# Patient Record
Sex: Female | Born: 1990 | Race: White | Hispanic: No | Marital: Married | Smoking: Former smoker
Health system: Southern US, Community
[De-identification: ages and names within clinical notes are randomized; demographics above are authoritative.]

## PROBLEM LIST (undated history)

## (undated) ENCOUNTER — Inpatient Hospital Stay (HOSPITAL_COMMUNITY): Payer: Self-pay

## (undated) DIAGNOSIS — F329 Major depressive disorder, single episode, unspecified: Secondary | ICD-10-CM

## (undated) DIAGNOSIS — T783XXA Angioneurotic edema, initial encounter: Secondary | ICD-10-CM

## (undated) DIAGNOSIS — J45909 Unspecified asthma, uncomplicated: Secondary | ICD-10-CM

## (undated) DIAGNOSIS — L509 Urticaria, unspecified: Secondary | ICD-10-CM

## (undated) DIAGNOSIS — E669 Obesity, unspecified: Secondary | ICD-10-CM

## (undated) DIAGNOSIS — F32A Depression, unspecified: Secondary | ICD-10-CM

## (undated) HISTORY — PX: DILATION AND CURETTAGE OF UTERUS: SHX78

## (undated) HISTORY — DX: Urticaria, unspecified: L50.9

## (undated) HISTORY — DX: Angioneurotic edema, initial encounter: T78.3XXA

---

## 2016-10-01 ENCOUNTER — Encounter (HOSPITAL_COMMUNITY): Payer: Self-pay | Admitting: *Deleted

## 2016-10-01 ENCOUNTER — Emergency Department (HOSPITAL_COMMUNITY)
Admission: EM | Admit: 2016-10-01 | Discharge: 2016-10-02 | Disposition: A | Discharge status: Home / Self Care | Payer: BLUE CROSS/BLUE SHIELD | Attending: Emergency Medicine | Admitting: Emergency Medicine

## 2016-10-01 DIAGNOSIS — E860 Dehydration: Secondary | ICD-10-CM | POA: Diagnosis not present

## 2016-10-01 DIAGNOSIS — R112 Nausea with vomiting, unspecified: Secondary | ICD-10-CM | POA: Diagnosis present

## 2016-10-01 DIAGNOSIS — R1011 Right upper quadrant pain: Secondary | ICD-10-CM | POA: Diagnosis not present

## 2016-10-01 DIAGNOSIS — R197 Diarrhea, unspecified: Secondary | ICD-10-CM | POA: Diagnosis not present

## 2016-10-01 DIAGNOSIS — F17200 Nicotine dependence, unspecified, uncomplicated: Secondary | ICD-10-CM | POA: Diagnosis not present

## 2016-10-01 HISTORY — DX: Major depressive disorder, single episode, unspecified: F32.9

## 2016-10-01 HISTORY — DX: Obesity, unspecified: E66.9

## 2016-10-01 HISTORY — DX: Depression, unspecified: F32.A

## 2016-10-01 LAB — CBC
HEMATOCRIT: 44.5 % (ref 36.0–46.0)
Hemoglobin: 15.2 g/dL — ABNORMAL HIGH (ref 12.0–15.0)
MCH: 28.8 pg (ref 26.0–34.0)
MCHC: 34.2 g/dL (ref 30.0–36.0)
MCV: 84.4 fL (ref 78.0–100.0)
PLATELETS: 283 10*3/uL (ref 150–400)
RBC: 5.27 MIL/uL — ABNORMAL HIGH (ref 3.87–5.11)
RDW: 12.4 % (ref 11.5–15.5)
WBC: 10.7 10*3/uL — AB (ref 4.0–10.5)

## 2016-10-01 LAB — COMPREHENSIVE METABOLIC PANEL
ALBUMIN: 4.3 g/dL (ref 3.5–5.0)
ALT: 19 U/L (ref 14–54)
AST: 25 U/L (ref 15–41)
Alkaline Phosphatase: 62 U/L (ref 38–126)
Anion gap: 10 (ref 5–15)
BUN: 8 mg/dL (ref 6–20)
CO2: 22 mmol/L (ref 22–32)
CREATININE: 0.87 mg/dL (ref 0.44–1.00)
Calcium: 9 mg/dL (ref 8.9–10.3)
Chloride: 104 mmol/L (ref 101–111)
GFR calc Af Amer: >60 mL/min (ref >60)
GFR calc non Af Amer: >60 mL/min (ref >60)
GLUCOSE: 94 mg/dL (ref 65–99)
Potassium: 3.5 mmol/L (ref 3.5–5.1)
Sodium: 136 mmol/L (ref 135–145)
Total Bilirubin: 0.8 mg/dL (ref 0.3–1.2)
Total Protein: 7 g/dL (ref 6.5–8.1)

## 2016-10-01 LAB — I-STAT BETA HCG BLOOD, ED (MC, WL, AP ONLY): I-stat hCG, quantitative: <5 m[IU]/mL (ref <5)

## 2016-10-01 LAB — URINALYSIS, ROUTINE W REFLEX MICROSCOPIC
BILIRUBIN URINE: NEGATIVE
GLUCOSE, UA: NEGATIVE mg/dL
HGB URINE DIPSTICK: NEGATIVE
Ketones, ur: NEGATIVE mg/dL
Leukocytes, UA: NEGATIVE
Nitrite: NEGATIVE
PH: 5 (ref 5.0–8.0)
PROTEIN: NEGATIVE mg/dL
Specific Gravity, Urine: 1.031 — ABNORMAL HIGH (ref 1.005–1.030)

## 2016-10-01 LAB — LIPASE, BLOOD: LIPASE: 22 U/L (ref 11–51)

## 2016-10-01 MED ORDER — ONDANSETRON 4 MG PO TBDP
ORAL_TABLET | ORAL | Status: AC
  Filled 2016-10-01: qty 1

## 2016-10-01 MED ORDER — ONDANSETRON 4 MG PO TBDP
4.0000 mg | ORAL_TABLET | Freq: Once | ORAL | Status: AC | PRN
  Administered 2016-10-01: 4 mg via ORAL

## 2016-10-01 NOTE — ED Triage Notes (Signed)
Pt reports eating pizza last night. Having n/v/d all night. Has sharp pains to right side that radiates around. Pt is hyperventilating at triage.

## 2016-10-01 NOTE — ED Notes (Signed)
After blood draw at triage, pt began hyperventilating. Pale at triage and reports nausea. Given cool wash cloth, zofran and instructed to deep breath.

## 2016-10-02 MED ORDER — ONDANSETRON HCL 4 MG/2ML IJ SOLN
4.0000 mg | Freq: Once | INTRAMUSCULAR | Status: AC
  Administered 2016-10-02: 4 mg via INTRAVENOUS
  Filled 2016-10-02: qty 2

## 2016-10-02 MED ORDER — ONDANSETRON 8 MG PO TBDP: 8.0000 mg | ORAL_TABLET | Freq: Three times a day (TID) | ORAL | 0 refills | Status: DC | PRN

## 2016-10-02 MED ORDER — SODIUM CHLORIDE 0.9 % IV BOLUS (SEPSIS)
1000.0000 mL | Freq: Once | INTRAVENOUS | Status: AC
  Administered 2016-10-02: 1000 mL via INTRAVENOUS

## 2016-10-02 MED ORDER — FENTANYL CITRATE (PF) 100 MCG/2ML IJ SOLN
25.0000 ug | Freq: Once | INTRAMUSCULAR | Status: AC
  Administered 2016-10-02: 25 ug via INTRAVENOUS
  Filled 2016-10-02: qty 2

## 2016-10-02 NOTE — Discharge Instructions (Signed)
CALL YOUR PRIMARY DOCTOR TODAY FOR FOLLOWUP GALLBLADDER ULTRASOUND  Abdominal (belly) pain can be caused by many things. any cases can be observed and treated at home after initial evaluation in the emergency department. Even though you are being discharged home, abdominal pain can be unpredictable. Therefore, you need a repeated exam if your pain does not resolve, returns, or worsens. Most patients with abdominal pain don't have to be admitted to the hospital or have surgery, but serious problems like appendicitis and gallbladder attacks can start out as nonspecific pain. Many abdominal conditions cannot be diagnosed in one visit, so follow-up evaluations are very important. SEEK IMMEDIATE MEDICAL ATTENTION IF: The pain does not go away or becomes severe, particularly over the next 8-12 hours.  A temperature above 100.84F develops.  Repeated vomiting occurs (multiple episodes).  The pain becomes localized to portions of the abdomen. The right side could possibly be appendicitis. In an adult, the left lower portion of the abdomen could be colitis or diverticulitis.  Blood is being passed in stools or vomit (bright red or black tarry stools).  Return also if you develop chest pain, difficulty breathing, dizziness or fainting, or become confused, poorly responsive, or inconsolable.

## 2016-10-02 NOTE — ED Notes (Signed)
Pt c/o RUQ pain with n/v since yesterday; also R flank pain (burning) intermittently

## 2016-10-02 NOTE — ED Notes (Signed)
Wickline MD at bedside  

## 2016-10-02 NOTE — ED Notes (Signed)
Pt given ginger ale and able to keep it down successfully

## 2016-10-02 NOTE — ED Provider Notes (Signed)
[facility]-EMERGENCY DEPT Provider Note   CSN: [id number] Arrival date & time: 10/01/16  1857   By signing my name below, I, Erica Farrell, attest that this documentation has been prepared under the direction and in the presence of [name], [name], MD. Electronically signed, Erica Farrell, ED Scribe. 10/02/16. 2:27 AM.  History   Chief Complaint Chief Complaint  Patient presents with  . Abdominal Pain  . Emesis  . Diarrhea   The history is provided by the patient and medical records. The history is limited by the condition of the patient. No language interpreter was used.  Abdominal Pain   This is a new problem. The current episode started 2 days ago. The problem occurs daily. The pain is associated with eating and suspicious food intake. The pain is located in the RLQ. The quality of the pain is dull, throbbing and shooting. The pain is at a severity of 5/10. Associated symptoms include diarrhea, nausea, vomiting and dysuria. Pertinent negatives include fever, hematochezia, melena, constipation, hematuria and headaches. The symptoms are relieved by certain positions.  Emesis   This is a new problem. The current episode started 2 days ago. The problem occurs more than 10 times per day. The problem has not changed since onset.The emesis has an appearance of stomach contents. There has been no fever. Associated symptoms include abdominal pain and diarrhea. Pertinent negatives include no chills, no cough, no fever, no headaches, no sweats and no URI. Risk factors include suspect food intake and ill contacts.  Diarrhea   This is a new problem. The current episode started 2 days ago. The problem occurs more than 10 times per day. The problem has not changed since onset.There has been no fever. Associated symptoms include abdominal pain and vomiting. Pertinent negatives include no chills, no sweats, no headaches, no URI and no cough.   Erica GrinderBrandie Farrell is a 26 y.o. female presenting to the Emergency  Department with chief complaint of persistent, episodic severe abdominal pains x 3 days s/p suspicious food intake. Pt states her and her husband at pizza that caused both of them to vomit ~2 days ago, her husband's symptoms have subsided and her symptoms have persisted. Multiple vomiting episodes noted since onset of symptoms with lingering nausea noted currently. Diarrhea, chest pain, SOB, abdominal and flank pains noted. Pt reports decreased solid and liquid intake d/t persistent N/V. She describes gradual onset, intermittent, moderate to severe, R sided abdominal pain radiating to the RLQ and back up to the flank area. No abdominal surgical Hx noted. Dysuria, cough denied.  Past Medical History:  Diagnosis Date  . Depression   . Obesity     There are no active problems to display for this patient.   History reviewed. No pertinent surgical history.  OB History    No data available       Home Medications    Prior to Admission medications   Not on File    Family History History reviewed. No pertinent family history.  Social History Social History  Substance Use Topics  . Smoking status: Current Some Day Smoker  . Smokeless tobacco: Not on file  . Alcohol use No     Allergies   Lexapro [escitalopram oxalate] and Wellbutrin [bupropion]   Review of Systems Review of Systems  Constitutional: Negative for chills and fever.  Respiratory: Positive for shortness of breath. Negative for cough.   Gastrointestinal: Positive for abdominal pain, diarrhea, nausea and vomiting. Negative for constipation, hematochezia and melena.  Genitourinary: Positive  for dysuria and flank pain. Negative for hematuria.  Neurological: Negative for headaches.  All other systems reviewed and are negative.    Physical Exam Updated Vital Signs BP 99/65   Pulse 71   Temp 97.9 F (36.6 C) (Oral)   Resp 18   LMP 09/24/2016   SpO2 96%   Physical Exam CONSTITUTIONAL: Well developed/well  nourished HEAD: Normocephalic/atraumatic EYES: EOMI/PERRL, no icterus ENMT: Mucous membranes dry NECK: supple no meningeal signs SPINE/BACK:entire spine nontender CV: S1/S2 noted, no murmurs/rubs/gallops noted LUNGS: Lungs are clear to auscultation bilaterally, no apparent distress ABDOMEN: soft, mild RUQ tenderness, no rebound or guarding, bowel sounds noted throughout abdomen GU:no cva tenderness NEURO: Pt is awake/alert/appropriate, moves all extremitiesx4.  No facial droop.   EXTREMITIES: pulses normal/equal, full ROM SKIN: warm, color normal PSYCH: no abnormalities of mood noted, alert and oriented to situation   ED Treatments / Results  DIAGNOSTIC STUDIES: Oxygen Saturation is 96% on RA, NL by my interpretation.    COORDINATION OF CARE: 2:10 AM-Discussed next steps with pt. Pt verbalized understanding and is agreeable with the plan. Will order medications and IV fluids.   Labs (all labs ordered are listed, but only abnormal results are displayed) Labs Reviewed  CBC - Abnormal; Notable for the following:       Result Value   WBC 10.7 (*)    RBC 5.27 (*)    Hemoglobin 15.2 (*)    All other components within normal limits  URINALYSIS, ROUTINE W REFLEX MICROSCOPIC - Abnormal; Notable for the following:    Color, Urine AMBER (*)    APPearance HAZY (*)    Specific Gravity, Urine 1.031 (*)    All other components within normal limits  LIPASE, BLOOD  COMPREHENSIVE METABOLIC PANEL  I-STAT BETA HCG BLOOD, ED ([facility], [facility], [facility] ONLY)    EKG  EKG Interpretation None       Radiology No results found.  Procedures Procedures   Medications Ordered in ED Medications  ondansetron (ZOFRAN-ODT) disintegrating tablet 4 mg (4 mg Oral Given 10/01/16 1909)  sodium chloride 0.9 % bolus 1,000 mL (0 mLs Intravenous Stopped 10/02/16 0359)  sodium chloride 0.9 % bolus 1,000 mL (0 mLs Intravenous Stopped 10/02/16 0359)  ondansetron (ZOFRAN) injection 4 mg (4 mg Intravenous Given 10/02/16 0221)    fentaNYL (SUBLIMAZE) injection 25 mcg (25 mcg Intravenous Given 10/02/16 0221)     Initial Impression / Assessment and Plan / ED Course  I have reviewed the triage vital signs and the nursing notes.  Pertinent labs  results that were available during my care of the patient were reviewed by me and considered in my medical decision making (see chart for details).     Pt here in the ED for nausea/vomiting/diarrhea and RUQ Pt is improving, taking PO fluids She did have RUQ tenderness but reports feeling improved I offered US imaging but she declines and will call her PCP later today for followup imaging This seems reasonable Pt otherwise stable for d/c home We discussed strict ER return precautions  Final Clinical Impressions(s) / ED Diagnoses   Final diagnoses:  Nausea vomiting and diarrhea  Dehydration  RUQ abdominal pain    New Prescriptions New Prescriptions   No medications on file  I personally performed the services described in this documentation, which was scribed in my presence. The recorded information has been reviewed and is accurate.        Zadie Rhine, MD 10/02/16 [phone]

## 2017-03-12 ENCOUNTER — Encounter (HOSPITAL_COMMUNITY): Payer: Self-pay | Admitting: Radiology

## 2017-03-12 ENCOUNTER — Emergency Department (HOSPITAL_COMMUNITY): Payer: BLUE CROSS/BLUE SHIELD

## 2017-03-12 ENCOUNTER — Emergency Department (HOSPITAL_COMMUNITY)
Admission: EM | Admit: 2017-03-12 | Discharge: 2017-03-12 | Disposition: A | Discharge status: Home / Self Care | Payer: BLUE CROSS/BLUE SHIELD | Attending: Emergency Medicine | Admitting: Emergency Medicine

## 2017-03-12 DIAGNOSIS — R202 Paresthesia of skin: Secondary | ICD-10-CM | POA: Insufficient documentation

## 2017-03-12 DIAGNOSIS — O99350 Diseases of the nervous system complicating pregnancy, unspecified trimester: Secondary | ICD-10-CM | POA: Diagnosis not present

## 2017-03-12 DIAGNOSIS — G43109 Migraine with aura, not intractable, without status migrainosus: Secondary | ICD-10-CM

## 2017-03-12 DIAGNOSIS — O99330 Smoking (tobacco) complicating pregnancy, unspecified trimester: Secondary | ICD-10-CM | POA: Diagnosis not present

## 2017-03-12 DIAGNOSIS — Z3A00 Weeks of gestation of pregnancy not specified: Secondary | ICD-10-CM | POA: Diagnosis not present

## 2017-03-12 DIAGNOSIS — R51 Headache: Secondary | ICD-10-CM | POA: Insufficient documentation

## 2017-03-12 DIAGNOSIS — F17200 Nicotine dependence, unspecified, uncomplicated: Secondary | ICD-10-CM | POA: Diagnosis not present

## 2017-03-12 DIAGNOSIS — R200 Anesthesia of skin: Secondary | ICD-10-CM

## 2017-03-12 DIAGNOSIS — F329 Major depressive disorder, single episode, unspecified: Secondary | ICD-10-CM | POA: Diagnosis not present

## 2017-03-12 DIAGNOSIS — O99340 Other mental disorders complicating pregnancy, unspecified trimester: Secondary | ICD-10-CM | POA: Diagnosis not present

## 2017-03-12 LAB — DIFFERENTIAL
BASOS PCT: 0 %
Basophils Absolute: 0 10*3/uL (ref 0.0–0.1)
EOS ABS: 0.2 10*3/uL (ref 0.0–0.7)
EOS PCT: 1 %
Lymphocytes Relative: 21 %
Lymphs Abs: 2.9 10*3/uL (ref 0.7–4.0)
MONO ABS: 0.6 10*3/uL (ref 0.1–1.0)
MONOS PCT: 4 %
NEUTROS ABS: 10.4 10*3/uL — AB (ref 1.7–7.7)
Neutrophils Relative %: 74 %

## 2017-03-12 LAB — COMPREHENSIVE METABOLIC PANEL
ALT: 15 U/L (ref 14–54)
ANION GAP: 7 (ref 5–15)
AST: 17 U/L (ref 15–41)
Albumin: 3.9 g/dL (ref 3.5–5.0)
Alkaline Phosphatase: 53 U/L (ref 38–126)
BUN: 9 mg/dL (ref 6–20)
CHLORIDE: 105 mmol/L (ref 101–111)
CO2: 22 mmol/L (ref 22–32)
CREATININE: 0.75 mg/dL (ref 0.44–1.00)
Calcium: 8.9 mg/dL (ref 8.9–10.3)
Glucose, Bld: 100 mg/dL — ABNORMAL HIGH (ref 65–99)
Potassium: 3.5 mmol/L (ref 3.5–5.1)
Sodium: 134 mmol/L — ABNORMAL LOW (ref 135–145)
Total Bilirubin: 0.5 mg/dL (ref 0.3–1.2)
Total Protein: 7 g/dL (ref 6.5–8.1)

## 2017-03-12 LAB — I-STAT TROPONIN, ED: TROPONIN I, POC: 0 ng/mL (ref 0.00–0.08)

## 2017-03-12 LAB — CBC
HEMATOCRIT: 43.2 % (ref 36.0–46.0)
Hemoglobin: 14.8 g/dL (ref 12.0–15.0)
MCH: 29.5 pg (ref 26.0–34.0)
MCHC: 34.3 g/dL (ref 30.0–36.0)
MCV: 86.1 fL (ref 78.0–100.0)
Platelets: 253 10*3/uL (ref 150–400)
RBC: 5.02 MIL/uL (ref 3.87–5.11)
RDW: 12.5 % (ref 11.5–15.5)
WBC: 14.1 10*3/uL — ABNORMAL HIGH (ref 4.0–10.5)

## 2017-03-12 LAB — I-STAT CHEM 8, ED
BUN: 9 mg/dL (ref 6–20)
CREATININE: 0.7 mg/dL (ref 0.44–1.00)
Calcium, Ion: 1.12 mmol/L — ABNORMAL LOW (ref 1.15–1.40)
Chloride: 105 mmol/L (ref 101–111)
GLUCOSE: 102 mg/dL — AB (ref 65–99)
HEMATOCRIT: 44 % (ref 36.0–46.0)
HEMOGLOBIN: 15 g/dL (ref 12.0–15.0)
Potassium: 3.6 mmol/L (ref 3.5–5.1)
Sodium: 138 mmol/L (ref 135–145)
TCO2: 21 mmol/L — AB (ref 22–32)

## 2017-03-12 LAB — APTT: aPTT: 28 seconds (ref 24–36)

## 2017-03-12 LAB — CBG MONITORING, ED: Glucose-Capillary: 92 mg/dL (ref 65–99)

## 2017-03-12 LAB — PROTIME-INR
INR: 1.03
PROTHROMBIN TIME: 13.4 s (ref 11.4–15.2)

## 2017-03-12 MED ORDER — ACETAMINOPHEN 500 MG PO TABS
1000.0000 mg | ORAL_TABLET | Freq: Once | ORAL | Status: AC
  Administered 2017-03-12: 1000 mg via ORAL
  Filled 2017-03-12: qty 2

## 2017-03-12 MED ORDER — SODIUM CHLORIDE 0.9 % IV BOLUS (SEPSIS)
1000.0000 mL | Freq: Once | INTRAVENOUS | Status: AC
  Administered 2017-03-12: 1000 mL via INTRAVENOUS

## 2017-03-12 MED ORDER — METOCLOPRAMIDE HCL 5 MG/ML IJ SOLN
10.0000 mg | Freq: Once | INTRAMUSCULAR | Status: AC
  Administered 2017-03-12: 10 mg via INTRAVENOUS
  Filled 2017-03-12: qty 2

## 2017-03-12 NOTE — Consult Note (Signed)
Neurology Consultation Reason for Consult: Right-sided numbness Referring Physician: Verdie MosherLiu, D  CC: Right-sided numbness  History is obtained from: Patient  HPI: Erica Farrell is a 26 y.o. female who presents with right-sided numbness that started sometime between 1011 and 1130.  She states that she initially noticed tingling in her right hand which then spread up her arm and to include her face.  She also noticed spots in her vision that spread to include most of the right side.  There was associated flashing lights as well as zigzag lines associated with this loss of vision.  The vision change has improved, but she has persistent tingling and numbness of the right side, and she has developed a persistent left-sided headache.   LKW: 11 a.m. tpa given?: no, outside of window, mild symptoms   ROS: A 14 point ROS was performed and is negative except as noted in the HPI.  Past Medical History:  Diagnosis Date  . Depression   . Obesity      No family history on file.   Social History:  reports that she has been smoking.  She does not have any smokeless tobacco history on file. She reports that she does not drink alcohol or use drugs.   Exam: Current vital signs: BP 110/73   Pulse 90   Temp 98.2 F (36.8 C) (Oral)   Resp 17   LMP 09/24/2016   SpO2 98%  Vital signs in last 24 hours: Temp:  [98.2 F (36.8 C)] 98.2 F (36.8 C) (11/13 1529) Pulse Rate:  [82-100] 90 (11/13 1645) Resp:  [9-18] 17 (11/13 1645) BP: (110-136)/(73-102) 110/73 (11/13 1645) SpO2:  [98 %-100 %] 98 % (11/13 1645)   Physical Exam  Constitutional: Appears well-developed and well-nourished.  Psych: Affect appropriate to situation Eyes: No scleral injection HENT: No OP obstrucion Head: Normocephalic.  Cardiovascular: Normal rate and regular rhythm.  Respiratory: Effort normal and breath sounds normal to anterior ascultation GI: Soft.  No distension. There is no tenderness.  Skin: WDI  Neuro: Mental  Status: Patient is awake, alert, oriented to person, place, month, year, and situation. Patient is able to give a clear and coherent history. No signs of aphasia or neglect Cranial Nerves: II: Visual Fields are full. Pupils are equal, round, and reactive to light.   III,IV, VI: EOMI without ptosis or diploplia.  V: Facial sensation is decreased on the right VII: Facial movement is symmetric.  VIII: hearing is intact to voice X: Uvula elevates symmetrically XI: Shoulder shrug is symmetric. XII: tongue is midline without atrophy or fasciculations.  Motor: Tone is normal. Bulk is normal. 5/5 strength was present in all four extremities.  Sensory: Sensation is decreased on the right Cerebellar: FNF intact bilaterally      I have reviewed labs in epic and the results pertinent to this consultation are: CMP-unremarkable CBC-mild leukocytosis  I have reviewed the images obtained: CT head-unremarkable  Impression: 26 year old female with right-sided spreading paresthesias and visual scotoma.  Given that she has never had a complicated migraine prior to this, I think imaging would be prudent but if negative then I think complicated migraine is by far the most likely etiology of the symptoms.  Recommendations: 1) MRI brain, if negative would treat as complicated migraine   Ritta SlotMcNeill Kalum Minner, MD [facility] [phone]  If 7pm- 7am, please page neurology on call as listed in AMION.

## 2017-03-12 NOTE — ED Provider Notes (Signed)
[facility] Provider Note   CSN: [id number] Arrival date & time: 03/12/17  1517   An emergency department physician performed an initial assessment on this suspected stroke patient at 1532.  History   Chief Complaint Chief Complaint  Patient presents with  . Numbness    HPI Erica Farrell is a 26 y.o. female.  The history is provided by the patient.  Neurologic Problem  This is a new problem. The current episode started 3 to 5 hours ago. The problem occurs constantly. The problem has been gradually improving. Associated symptoms include headaches. Pertinent negatives include no chest pain, no abdominal pain and no shortness of breath. Nothing aggravates the symptoms. Nothing relieves the symptoms. She has tried nothing for the symptoms.    26 year old female with history of depression and obesity who presents with right-sided numbness.  She states that she has been in her usual state of health this morning while at the doctor's office for her son's occupational therapy as she had sudden onset of right arm numbness and tingling followed by right tongue and mouth tingling and numbness.  EMS was called to the office, and she states that the blood sugar looked okay and she was not initially taken to the hospital.  They felt it was likely due to anxiety.  She states that her symptoms acutely got worse suddenly involving numbness of the right leg, which prompted her to come to the ED.  States that since she has been in the ED she has now developed a left-sided headache.  States that she has had headaches in the past but this is very atypical for her to have numbness.  Feels nauseated but denies any vomiting.  No vision changes.  Initially felt that she had some speech finding difficulties on symptom onset, but now resolved.  No recent fevers or chills.  Currently she reports that she is pregnant, unknown gestational age or LMP.  Past Medical History:    Diagnosis Date  . Depression   . Obesity     There are no active problems to display for this patient.   No past surgical history on file.  OB History    Gravida Para Term Preterm AB Living   1             SAB TAB Ectopic Multiple Live Births                   Home Medications    Prior to Admission medications   Medication Sig Start Date End Date Taking? Authorizing Provider  acetaminophen (TYLENOL) 325 MG tablet Take 325-650 mg every 6 (six) hours as needed by mouth (for pain or headaches).   Yes [provider]  ondansetron (ZOFRAN ODT) 8 MG disintegrating tablet Take 1 tablet (8 mg total) by mouth every 8 (eight) hours as needed. Patient not taking: Reported on 03/12/2017 10/02/16   [name], Donald, MD    Family History No family history on file.  Social History Social History   Tobacco Use  . Smoking status: Current Some Day Smoker  Substance Use Topics  . Alcohol use: No  . Drug use: No     Allergies   Escitalopram; Lexapro [escitalopram oxalate]; and Wellbutrin [bupropion]   Review of Systems Review of Systems  Constitutional: Negative for fever.  Respiratory: Negative for shortness of breath.   Cardiovascular: Negative for chest pain.  Gastrointestinal: Negative for abdominal pain.  Neurological: Positive for headaches.  All other systems  reviewed and are negative.    Physical Exam Updated Vital Signs BP 117/73   Pulse 89   Temp 98.2 F (36.8 C) (Oral)   Resp (!) 9   LMP 09/24/2016   SpO2 100%   Physical Exam Physical Exam  Nursing note and vitals reviewed. Constitutional: Well developed, well nourished, non-toxic, and in no acute distress Head: Normocephalic and atraumatic.  Mouth/Throat: Oropharynx is clear and moist.  Neck: Normal range of motion. Neck supple.  Cardiovascular: Normal rate and regular rhythm.   Pulmonary/Chest: Effort normal and breath sounds normal.  Abdominal: Soft. There is no tenderness. There is no  rebound and no guarding.  Musculoskeletal: Normal range of motion.  Skin: Skin is warm and dry.  Psychiatric: Cooperative Neurological:  Alert, oriented to person, place, time, and situation. Memory grossly in tact. Fluent speech. No dysarthria or aphasia.  Cranial nerves: VF are full. EOMI without nystagmus. No gaze deviation. Facial muscles symmetric with activation. Sensation to light touch over face in tact bilaterally. Hearing grossly in tact. Palate elevates symmetrically. Head turn and shoulder shrug are intact. Tongue midline.  Reflexes defered.  Muscle bulk and tone normal. No pronator drift. Moves all extremities symmetrically. Sensation to light touch is diminished over the right face, right upper extremity, right lower extremity Coordination reveals no dysmetria with finger to nose. Gait is narrow-based and steady. Non-ataxic.   ED Treatments / Results  Labs (all labs ordered are listed, but only abnormal results are displayed) Labs Reviewed  CBC - Abnormal; Notable for the following components:      Result Value   WBC 14.1 (*)    All other components within normal limits  DIFFERENTIAL - Abnormal; Notable for the following components:   Neutro Abs 10.4 (*)    All other components within normal limits  COMPREHENSIVE METABOLIC PANEL - Abnormal; Notable for the following components:   Sodium 134 (*)    Glucose, Bld 100 (*)    All other components within normal limits  I-STAT CHEM 8, ED - Abnormal; Notable for the following components:   Glucose, Bld 102 (*)    Calcium, Ion 1.12 (*)    TCO2 21 (*)    All other components within normal limits  PROTIME-INR  APTT  I-STAT TROPONIN, ED  CBG MONITORING, ED    EKG  EKG Interpretation  Date/Time:  Tuesday March 12 2017 15:25:25 EST Ventricular Rate:  95 PR Interval:  132 QRS Duration: 90 QT Interval:  350 QTC Calculation: 439 R Axis:   46 Text Interpretation:  Normal sinus rhythm Possible Left atrial enlargement  Borderline ECG No acute changes No old tracing to compare Confirmed by Derwood Kaplananavati, Ankit [phone]([id number]) on 03/12/2017 3:38:48 PM       Radiology Mr Brain Wo Contrast  Result Date: 03/12/2017 CLINICAL DATA:  Right-sided numbness EXAM: MRI HEAD WITHOUT CONTRAST TECHNIQUE: Multiplanar, multiecho pulse sequences of the brain and surrounding structures were obtained without intravenous contrast. COMPARISON:  Head CT 03/12/2017 FINDINGS: Brain: The midline structures are normal. There is no acute infarct or acute hemorrhage. No mass lesion, hydrocephalus, dural abnormality or extra-axial collection. The brain parenchymal signal is normal. No age-advanced or lobar predominant atrophy. No chronic microhemorrhage or superficial siderosis. Vascular: Major intracranial arterial and venous sinus flow voids are preserved. Skull and upper cervical spine: The visualized skull base, calvarium, upper cervical spine and extracranial soft tissues are normal. Sinuses/Orbits: No fluid levels or advanced mucosal thickening. No mastoid or middle ear effusion. Normal orbits. IMPRESSION: Normal  brain MRI. Electronically Signed   By: Deatra Robinson M.D.   On: 03/12/2017 17:42   Ct Head Code Stroke Wo Contrast  Result Date: 03/12/2017 CLINICAL DATA:  Code stroke. 26 year old female with right side weakness and blurred vision. Last seen normal 1100 hours. EXAM: CT HEAD WITHOUT CONTRAST TECHNIQUE: Contiguous axial images were obtained from the base of the skull through the vertex without intravenous contrast. COMPARISON:  None. FINDINGS: Brain: No midline shift, ventriculomegaly, mass effect, evidence of mass lesion, intracranial hemorrhage or evidence of cortically based acute infarction. Gray-white matter differentiation is within normal limits throughout the brain. Vascular: No suspicious intracranial vascular hyperdensity. Skull: Negative.  No acute osseous abnormality identified. Sinuses/Orbits: Well pneumatized. Other: Visualized  orbits and scalp soft tissues are within normal limits. ASPECTS [facility] Stroke Program Early CT Score) - Ganglionic level infarction (caudate, lentiform nuclei, internal capsule, insula, M1-M3 cortex): 7 - Supraganglionic infarction (M4-M6 cortex): 3 Total score (0-10 with 10 being normal): 10 IMPRESSION: 1. Normal noncontrast CT appearance of the brain. 2. ASPECTS is 10. 3. The above was relayed via text pager to Dr. Ritta Slot on 03/12/2017 at 15:52 . Electronically Signed   By: Odessa Fleming M.D.   On: 03/12/2017 15:52    Procedures Procedures (including critical care time)  Medications Ordered in ED Medications  sodium chloride 0.9 % bolus 1,000 mL (1,000 mLs Intravenous New Bag/Given 03/12/17 1858)  acetaminophen (TYLENOL) tablet 1,000 mg (1,000 mg Oral Given 03/12/17 1929)  metoCLOPramide (REGLAN) injection 10 mg (10 mg Intravenous Given 03/12/17 1859)     Initial Impression / Assessment and Plan / ED Course  I have reviewed the triage vital signs and the nursing notes.  Pertinent labs & imaging results that were available during my care of the patient were reviewed by me and considered in my medical decision making (see chart for details).    patient was code stroke activation by Dr. Rhunette Croft in triage.  Patient was also evaluated by Dr. Amada Jupiter from neurology who felt that this is less likely stroke more likely atypical migraine headache.  CT head visualized and shows no acute intracranial processes.  MRI was recommended by neurology and if negative could be discharged home.  In the interim she did receive migraine cocktail with Reglan, Tylenol and IV fluids given that she is pregnant.  She does feel improved in the ED.  MRI visualized and shows no acute intracranial processes.  She will be discharged home with supportive care instructions. Strict return and follow-up instructions reviewed. She expressed understanding of all discharge instructions and felt comfortable with the  plan of care.   Final Clinical Impressions(s) / ED Diagnoses   Final diagnoses:  Numbness    ED Discharge Orders    None       Lavera Guise, MD 03/12/17 2006

## 2017-03-12 NOTE — Discharge Instructions (Signed)
Your MRI is normal. Please follow-up closely with your primary care doctor This is likely atypical migraine per our neurologist. Please continue to take tylenol as needed for headache. Return for worsening symptoms, including confusion, intractable vomiting, escalating pain, or any other symptoms concerning to you.

## 2017-03-12 NOTE — ED Notes (Signed)
Pt verbalizes understanding of d/c instructions. Pt ambulatory at d/c with all belongings.   

## 2017-03-12 NOTE — Code Documentation (Signed)
26[facility] at [id number].  Patient was at daycare with her son at [id number] when she had difficulty filling out paperwork.  She describes right facial numbness that progressed to her right tongue, arm and toes.  She also reports having blurred vision at that time.  Patient has a h/o headaches.  Of note, patient recently found out she is pregnant, however, she has not been to the doctor yet.  Code stroke called in the ED.  Stroke team to the bedside.  NIHSS 1, see documentation for details and code stroke times.  Patient continuing to report right sided decreased sensation on exam.  No acute stroke treatment at this time.  Code stroke canceled.  Bedside handoff with ED RN Lanora ManisElizabeth.

## 2017-03-12 NOTE — ED Notes (Signed)
Patient transported to MRI 

## 2017-03-12 NOTE — ED Triage Notes (Addendum)
Patient LSN 11am pt sts when she was checking in her her child at 11:30 she had acute onset of right eye blurry vision and spots in her vision. Patient states she then had numbness and subjective right sided weakness starting in her hands and then went into her face and tongue. Patient states EMS was called and her BP and CBG were normal and they attributed it to anxiety. Patient was unable to go to hospital then due to taking care of her child. Patietn states symptoms started to ease up but then increased in intensity in the last few hours. Pt ambulatory with steady gait, no facial droop or arm or leg drift noted.

## 2017-03-12 NOTE — ED Notes (Signed)
Code stroke canceled by Dr. Amada JupiterKirkpatrick

## 2017-05-09 ENCOUNTER — Inpatient Hospital Stay (HOSPITAL_COMMUNITY)
Admission: AD | Admit: 2017-05-09 | Discharge: 2017-05-10 | Disposition: A | Discharge status: Home / Self Care | Payer: BLUE CROSS/BLUE SHIELD | Source: Ambulatory Visit | Attending: Obstetrics and Gynecology | Admitting: Obstetrics and Gynecology

## 2017-05-09 ENCOUNTER — Encounter (HOSPITAL_COMMUNITY): Payer: Self-pay | Admitting: *Deleted

## 2017-05-09 ENCOUNTER — Inpatient Hospital Stay (HOSPITAL_COMMUNITY): Payer: BLUE CROSS/BLUE SHIELD

## 2017-05-09 DIAGNOSIS — F17200 Nicotine dependence, unspecified, uncomplicated: Secondary | ICD-10-CM | POA: Insufficient documentation

## 2017-05-09 DIAGNOSIS — Z79899 Other long term (current) drug therapy: Secondary | ICD-10-CM | POA: Diagnosis not present

## 2017-05-09 DIAGNOSIS — Z9889 Other specified postprocedural states: Secondary | ICD-10-CM | POA: Insufficient documentation

## 2017-05-09 DIAGNOSIS — N939 Abnormal uterine and vaginal bleeding, unspecified: Secondary | ICD-10-CM | POA: Diagnosis not present

## 2017-05-09 DIAGNOSIS — O0380 Unspecified complication following complete or unspecified spontaneous abortion: Secondary | ICD-10-CM

## 2017-05-09 DIAGNOSIS — R58 Hemorrhage, not elsewhere classified: Secondary | ICD-10-CM

## 2017-05-09 LAB — CBC WITH DIFFERENTIAL/PLATELET
BASOS PCT: 0 %
Basophils Absolute: 0 10*3/uL (ref 0.0–0.1)
Eosinophils Absolute: 0.3 10*3/uL (ref 0.0–0.7)
Eosinophils Relative: 2 %
HEMATOCRIT: 33.7 % — AB (ref 36.0–46.0)
Hemoglobin: 11.6 g/dL — ABNORMAL LOW (ref 12.0–15.0)
LYMPHS PCT: 25 %
Lymphs Abs: 3.5 10*3/uL (ref 0.7–4.0)
MCH: 29.1 pg (ref 26.0–34.0)
MCHC: 34.4 g/dL (ref 30.0–36.0)
MCV: 84.7 fL (ref 78.0–100.0)
MONOS PCT: 4 %
Monocytes Absolute: 0.5 10*3/uL (ref 0.1–1.0)
NEUTROS ABS: 9.8 10*3/uL — AB (ref 1.7–7.7)
Neutrophils Relative %: 69 %
Platelets: 220 10*3/uL (ref 150–400)
RBC: 3.98 MIL/uL (ref 3.87–5.11)
RDW: 12.4 % (ref 11.5–15.5)
WBC: 14.1 10*3/uL — ABNORMAL HIGH (ref 4.0–10.5)

## 2017-05-09 LAB — SAMPLE TO BLOOD BANK

## 2017-05-09 MED ORDER — KETOROLAC TROMETHAMINE 30 MG/ML IJ SOLN
30.0000 mg | Freq: Once | INTRAMUSCULAR | Status: AC
  Administered 2017-05-09: 30 mg via INTRAVENOUS
  Filled 2017-05-09: qty 1

## 2017-05-09 MED ORDER — SODIUM CHLORIDE 0.9 % IV SOLN
INTRAVENOUS | Status: DC
  Administered 2017-05-09: 22:00:00 via INTRAVENOUS

## 2017-05-09 NOTE — MAU Provider Note (Signed)
Chief Complaint:  Vaginal Bleeding   First Provider Initiated Contact with Patient 05/09/17 2059      HPI: Erica Farrell is a 27 y.o. [id number] who presents to maternity admissions reporting heavy vaginal bleeding after an elective abortion for anencephalic baby on Monday.  Started cramping this evening and then bled heavily with clots. Procedure done at [facility] but patient lives here so EMS brought her here.. She reports vaginal bleeding, but no vaginal itching/burning, urinary symptoms, h/a, dizziness, n/v, or fever/chills.    Vaginal Bleeding  The patient's primary symptoms include pelvic pain and vaginal bleeding. The patient's pertinent negatives include no genital itching, genital lesions or genital odor. This is a new problem. The current episode started today. The problem occurs constantly. The problem has been unchanged. The pain is moderate. The problem affects both sides. Associated symptoms include abdominal pain and back pain. Pertinent negatives include no constipation, diarrhea, fever, headaches, nausea or vomiting. The vaginal discharge was bloody. The vaginal bleeding is heavier than menses. She has been passing clots. Nothing aggravates the symptoms. She has tried nothing for the symptoms. She uses nothing for contraception. Her past medical history is significant for a terminated pregnancy.    RN Note: Pt had termination on Monday( due to acephali ). Pt started having pain and cramping around 5pm. Then started bleeding. Soaking a pad every hour. Arrived EMS. Given 50 mcg of fentanyl. Pain now 6.    Past Medical History: Past Medical History:  Diagnosis Date  . Depression   . Obesity     Past obstetric history: OB History  Gravida Para Term Preterm AB Living  3 1 1   1 1   SAB TAB Ectopic Multiple Live Births          1    # Outcome Date GA Lbr Len/2nd Weight Sex Delivery Anes PTL Lv  3 Current           2 AB           1 Term               Past Surgical  History: No past surgical history on file.  Family History: No family history on file.  Social History: Social History   Tobacco Use  . Smoking status: Current Some Day Smoker  Substance Use Topics  . Alcohol use: No  . Drug use: No    Allergies:  Allergies  Allergen Reactions  . Escitalopram Other (See Comments)    Felt like throat was closing  . Lexapro [Escitalopram Oxalate] Other (See Comments)    Felt like throat was closing (at home/took Benadryl)  . Wellbutrin [Bupropion] Rash    Meds:  Medications Prior to Admission  Medication Sig Dispense Refill Last Dose  . acetaminophen (TYLENOL) 325 MG tablet Take 325-650 mg every 6 (six) hours as needed by mouth (for pain or headaches).   PRN at PRN  . ondansetron (ZOFRAN ODT) 8 MG disintegrating tablet Take 1 tablet (8 mg total) by mouth every 8 (eight) hours as needed. (Patient not taking: Reported on 03/12/2017) 4 tablet 0 Not Taking at Unknown time    I have reviewed patient's Past Medical Hx, Surgical Hx, Family Hx, Social Hx, medications and allergies.  ROS:  Review of Systems  Constitutional: Negative for fever.  Gastrointestinal: Positive for abdominal pain. Negative for constipation, diarrhea, nausea and vomiting.  Genitourinary: Positive for pelvic pain and vaginal bleeding.  Musculoskeletal: Positive for back pain.  Neurological: Negative for headaches.  Other systems negative     Physical Exam   Patient Vitals for the past 24 hrs:  BP Temp Pulse Resp  05/09/17 2055 136/81 98.2 F (36.8 C) 72 18   Constitutional: Well-developed, well-nourished female in no acute distress.  Cardiovascular: normal rate and rhythm, no ectopy audible, S1 & S2 heard, no murmur Respiratory: normal effort, no distress. Lungs CTAB with no wheezes or crackles GI: Abd soft, non-tender.  Nondistended.  No rebound, No guarding.  Bowel Sounds audible  MS: Extremities nontender, no edema, normal ROM Neurologic: Alert and oriented  x 4.   Grossly nonfocal. GU: Neg CVAT. Skin:  Warm and Dry Psych:  Affect appropriate.  PELVIC EXAM: Cervix pink, visually closed, without lesion, vaginal walls and external genitalia normal                           About of blood clots removed from vaginal vault.  No active hemorrhage afterward.  Just a slow oozing of blood from cervical os.  Bimanual exam: Cervix firm, anterior, neg CMT, uterus tender, slightly enlarged, adnexa without tenderness, enlargement, or mass    Labs:    Results for orders placed or performed during the hospital encounter of 05/09/17 (from the past 24 hour(s))  CBC with Differential/Platelet     Status: Abnormal   Collection Time: 05/09/17  9:06 PM  Result Value Ref Range   WBC 14.1 (H) 4.0 - 10.5 K/uL   RBC 3.98 3.87 - 5.11 MIL/uL   Hemoglobin 11.6 (L) 12.0 - 15.0 g/dL   HCT 16.1 (L) 09.6 - 04.5 %   MCV 84.7 78.0 - 100.0 fL   MCH 29.1 26.0 - 34.0 pg   MCHC 34.4 30.0 - 36.0 g/dL   RDW 40.9 81.1 - 91.4 %   Platelets 220 150 - 400 K/uL   Neutrophils Relative % 69 %   Neutro Abs 9.8 (H) 1.7 - 7.7 K/uL   Lymphocytes Relative 25 %   Lymphs Abs 3.5 0.7 - 4.0 K/uL   Monocytes Relative 4 %   Monocytes Absolute 0.5 0.1 - 1.0 K/uL   Eosinophils Relative 2 %   Eosinophils Absolute 0.3 0.0 - 0.7 K/uL   Basophils Relative 0 %   Basophils Absolute 0.0 0.0 - 0.1 K/uL  Sample to Blood Bank     Status: None   Collection Time: 05/09/17  9:06 PM  Result Value Ref Range   Blood Bank Specimen SAMPLE AVAILABLE FOR TESTING    Sample Expiration 05/12/2017      Imaging:  US Pelvis Transvanginal Non-ob (tv Only)  Result Date: 05/09/2017 CLINICAL DATA:  Abdominal cramping and vaginal bleeding. Patient had a therapeutic abortion on 05/06/2017. EXAM: TRANSABDOMINAL AND TRANSVAGINAL ULTRASOUND OF PELVIS TECHNIQUE: Both transabdominal and transvaginal ultrasound examinations of the pelvis were performed. Transabdominal technique was performed for global imaging of  the pelvis including uterus, ovaries, adnexal regions, and pelvic cul-de-sac. It was necessary to proceed with endovaginal exam following the transabdominal exam to visualize the endometrium. COMPARISON:  None FINDINGS: Uterus Measurements: 10.7 x 6.0 x 6.5 cm. Heterogeneous appearance of the myometrium particularly in the posterior fundus. Endometrium Thickness: 11 mm. Heterogeneous appearance of the endometrium, with indistinct transitional zone. No internal blood flow seen. Right ovary Measurements: 2.8 x 1.1 x 2.0 cm. Normal appearance/no adnexal mass. Left ovary Measurements: 2.8 x 1.3 x 1.9 cm. Normal appearance. Within the left adnexa, there is a complex in echogenicity mass which measures  5.2 x 4.0 x 3.4 cm. No internal blood flow is noted. Other findings No abnormal free fluid. IMPRESSION: Enlarged uterus with heterogeneous appearance of the myometrium, particular in the posterior fundus. This may be sequela of post gravid state, or potentially represent a myometrial injury. Heterogeneous appearance of the endometrium. No internal blood flow seen. This may represent a hemorrhage within the endometrial canal or potentially retained products of conception. 5.2 cm complex in echogenicity right adnexal mass without internal blood flow. This may represent an organizing blood clot, or less likely a exophytic hemorrhagic right ovarian cyst. These results were called by telephone at the time of interpretation on 05/09/2017 at 11:21 pm to midwife Wynelle BourgeoisMARIE WILLIAMS , who verbally acknowledged these results. Electronically Signed   By: Ted Mcalpineobrinka  Dimitrova M.D.   On: 05/09/2017 23:31   [place] Pelvis Limited (transabdominal Only)  Result Date: 05/09/2017 CLINICAL DATA:  Abdominal cramping and vaginal bleeding. Patient had a therapeutic abortion on 05/06/2017. EXAM: TRANSABDOMINAL AND TRANSVAGINAL ULTRASOUND OF PELVIS TECHNIQUE: Both transabdominal and transvaginal ultrasound examinations of the pelvis were performed.  Transabdominal technique was performed for global imaging of the pelvis including uterus, ovaries, adnexal regions, and pelvic cul-de-sac. It was necessary to proceed with endovaginal exam following the transabdominal exam to visualize the endometrium. COMPARISON:  None FINDINGS: Uterus Measurements: 10.7 x 6.0 x 6.5 cm. Heterogeneous appearance of the myometrium particularly in the posterior fundus. Endometrium Thickness: 11 mm. Heterogeneous appearance of the endometrium, with indistinct transitional zone. No internal blood flow seen. Right ovary Measurements: 2.8 x 1.1 x 2.0 cm. Normal appearance/no adnexal mass. Left ovary Measurements: 2.8 x 1.3 x 1.9 cm. Normal appearance. Within the left adnexa, there is a complex in echogenicity mass which measures 5.2 x 4.0 x 3.4 cm. No internal blood flow is noted. Other findings No abnormal free fluid. IMPRESSION: Enlarged uterus with heterogeneous appearance of the myometrium, particular in the posterior fundus. This may be sequela of post gravid state, or potentially represent a myometrial injury. Heterogeneous appearance of the endometrium. No internal blood flow seen. This may represent a hemorrhage within the endometrial canal or potentially retained products of conception. 5.2 cm complex in echogenicity right adnexal mass without internal blood flow. This may represent an organizing blood clot, or less likely a exophytic hemorrhagic right ovarian cyst. These results were called by telephone at the time of interpretation on 05/09/2017 at 11:21 pm to midwife Wynelle BourgeoisMARIE WILLIAMS , who verbally acknowledged these results. Electronically Signed   By: Ted Mcalpineobrinka  Dimitrova M.D.   On: 05/09/2017 23:31    MAU Course/MDM: I have ordered labs as follows:  See above.  Hemoglobin is not critically low.  Imaging ordered: ultrasound as above Results reviewed. With Dr Alysia PennaErvin who was Consulted.  He is reassured that there is no free fluid and that bleeding is lessened.  He recommends  Methergin series and followup with surgeons closely .Marland Kitchen.   Treatments in MAU included IV fluids, analgesia.   Pt stable at time of discharge.  Assessment: S/P elective abortion for anencephalic fetus Postoperative bleeding Right adnexal mass, uncertain etiology  Plan: Discharge home Recommend close followup with [facility] physicians today Rx sent for Methergine for bleeding Rx Ibuprofen for pain Rx Tramadol, limited Rx for pain  Call [facility] today for follow up  Encouraged to return here or to other Urgent Care/ED if she develops worsening of symptoms, increase in pain, fever, or other concerning symptoms.   Wynelle BourgeoisMarie Williams CNM, MSN Certified Nurse-Midwife 05/09/2017 9:00 PM

## 2017-05-09 NOTE — MAU Note (Signed)
Pt had termination on Monday( due to acephali ). Pt started having pain and cramping around 5pm. Then started bleeding. Soaking a pad every hour. Arrived EMS. Given 50 mcg of fentanyl. Pain now 6.

## 2017-05-10 DIAGNOSIS — O0380 Unspecified complication following complete or unspecified spontaneous abortion: Secondary | ICD-10-CM

## 2017-05-10 MED ORDER — IBUPROFEN 600 MG PO TABS: 600.0000 mg | ORAL_TABLET | Freq: Four times a day (QID) | ORAL | 1 refills | Status: DC | PRN

## 2017-05-10 MED ORDER — TRAMADOL HCL 50 MG PO TABS: 50.0000 mg | ORAL_TABLET | Freq: Four times a day (QID) | ORAL | 0 refills | Status: DC | PRN

## 2017-05-10 MED ORDER — METHYLERGONOVINE MALEATE 0.2 MG PO TABS: 0.2000 mg | ORAL_TABLET | Freq: Four times a day (QID) | ORAL | 0 refills | Status: DC

## 2017-05-10 NOTE — Discharge Instructions (Signed)

## 2018-06-22 ENCOUNTER — Emergency Department (HOSPITAL_COMMUNITY): Payer: BLUE CROSS/BLUE SHIELD

## 2018-06-22 ENCOUNTER — Encounter (HOSPITAL_COMMUNITY): Payer: Self-pay | Admitting: Emergency Medicine

## 2018-06-22 ENCOUNTER — Other Ambulatory Visit: Payer: Self-pay

## 2018-06-22 ENCOUNTER — Inpatient Hospital Stay (HOSPITAL_COMMUNITY)
Admission: EM | Admit: 2018-06-22 | Discharge: 2018-06-24 | DRG: 833 | Disposition: A | Discharge status: Home / Self Care | Payer: BLUE CROSS/BLUE SHIELD | Attending: Emergency Medicine | Admitting: Emergency Medicine

## 2018-06-22 DIAGNOSIS — R0682 Tachypnea, not elsewhere classified: Secondary | ICD-10-CM | POA: Diagnosis present

## 2018-06-22 DIAGNOSIS — J45909 Unspecified asthma, uncomplicated: Secondary | ICD-10-CM | POA: Diagnosis present

## 2018-06-22 DIAGNOSIS — Z3A27 27 weeks gestation of pregnancy: Secondary | ICD-10-CM

## 2018-06-22 DIAGNOSIS — R079 Chest pain, unspecified: Secondary | ICD-10-CM | POA: Diagnosis present

## 2018-06-22 DIAGNOSIS — O212 Late vomiting of pregnancy: Secondary | ICD-10-CM | POA: Diagnosis present

## 2018-06-22 DIAGNOSIS — Z3A26 26 weeks gestation of pregnancy: Secondary | ICD-10-CM

## 2018-06-22 DIAGNOSIS — R112 Nausea with vomiting, unspecified: Secondary | ICD-10-CM

## 2018-06-22 DIAGNOSIS — O99512 Diseases of the respiratory system complicating pregnancy, second trimester: Secondary | ICD-10-CM | POA: Diagnosis not present

## 2018-06-22 DIAGNOSIS — R000 Tachycardia, unspecified: Secondary | ICD-10-CM

## 2018-06-22 DIAGNOSIS — Z87891 Personal history of nicotine dependence: Secondary | ICD-10-CM

## 2018-06-22 DIAGNOSIS — J101 Influenza due to other identified influenza virus with other respiratory manifestations: Secondary | ICD-10-CM | POA: Diagnosis present

## 2018-06-22 DIAGNOSIS — J111 Influenza due to unidentified influenza virus with other respiratory manifestations: Secondary | ICD-10-CM | POA: Diagnosis present

## 2018-06-22 DIAGNOSIS — R0781 Pleurodynia: Secondary | ICD-10-CM

## 2018-06-22 DIAGNOSIS — O26892 Other specified pregnancy related conditions, second trimester: Secondary | ICD-10-CM | POA: Diagnosis not present

## 2018-06-22 DIAGNOSIS — E860 Dehydration: Secondary | ICD-10-CM

## 2018-06-22 HISTORY — DX: Unspecified asthma, uncomplicated: J45.909

## 2018-06-22 LAB — CBC WITH DIFFERENTIAL/PLATELET
Abs Immature Granulocytes: 0.16 10*3/uL — ABNORMAL HIGH (ref 0.00–0.07)
BASOS PCT: 0 %
Basophils Absolute: 0 10*3/uL (ref 0.0–0.1)
Eosinophils Absolute: 0.1 10*3/uL (ref 0.0–0.5)
Eosinophils Relative: 1 %
HCT: 35.9 % — ABNORMAL LOW (ref 36.0–46.0)
Hemoglobin: 11.6 g/dL — ABNORMAL LOW (ref 12.0–15.0)
Immature Granulocytes: 1 %
Lymphocytes Relative: 10 %
Lymphs Abs: 1.2 10*3/uL (ref 0.7–4.0)
MCH: 27.7 pg (ref 26.0–34.0)
MCHC: 32.3 g/dL (ref 30.0–36.0)
MCV: 85.7 fL (ref 80.0–100.0)
MONO ABS: 1.2 10*3/uL — AB (ref 0.1–1.0)
Monocytes Relative: 10 %
NEUTROS ABS: 9.8 10*3/uL — AB (ref 1.7–7.7)
Neutrophils Relative %: 78 %
PLATELETS: 208 10*3/uL (ref 150–400)
RBC: 4.19 MIL/uL (ref 3.87–5.11)
RDW: 13.2 % (ref 11.5–15.5)
WBC: 12.6 10*3/uL — ABNORMAL HIGH (ref 4.0–10.5)
nRBC: 0 % (ref 0.0–0.2)

## 2018-06-22 LAB — COMPREHENSIVE METABOLIC PANEL
ALT: 33 U/L (ref 0–44)
AST: 49 U/L — ABNORMAL HIGH (ref 15–41)
Albumin: 2.7 g/dL — ABNORMAL LOW (ref 3.5–5.0)
Alkaline Phosphatase: 74 U/L (ref 38–126)
Anion gap: 11 (ref 5–15)
BUN: <5 mg/dL — ABNORMAL LOW (ref 6–20)
CHLORIDE: 107 mmol/L (ref 98–111)
CO2: 16 mmol/L — ABNORMAL LOW (ref 22–32)
Calcium: 8.5 mg/dL — ABNORMAL LOW (ref 8.9–10.3)
Creatinine, Ser: 0.53 mg/dL (ref 0.44–1.00)
GFR calc Af Amer: >60 mL/min (ref >60)
GFR calc non Af Amer: >60 mL/min (ref >60)
Glucose, Bld: 94 mg/dL (ref 70–99)
Potassium: 3.2 mmol/L — ABNORMAL LOW (ref 3.5–5.1)
Sodium: 134 mmol/L — ABNORMAL LOW (ref 135–145)
Total Bilirubin: 0.3 mg/dL (ref 0.3–1.2)
Total Protein: 5.5 g/dL — ABNORMAL LOW (ref 6.5–8.1)

## 2018-06-22 LAB — LACTIC ACID, PLASMA: Lactic Acid, Venous: 1.7 mmol/L (ref 0.5–1.9)

## 2018-06-22 LAB — INFLUENZA PANEL BY PCR (TYPE A & B)
Influenza A By PCR: POSITIVE — AB
Influenza B By PCR: NEGATIVE

## 2018-06-22 MED ORDER — ONDANSETRON HCL 4 MG PO TABS: 4.0000 mg | ORAL_TABLET | Freq: Four times a day (QID) | ORAL | Status: DC | PRN

## 2018-06-22 MED ORDER — ACETAMINOPHEN 325 MG PO TABS
650.0000 mg | ORAL_TABLET | Freq: Four times a day (QID) | ORAL | Status: DC | PRN
  Administered 2018-06-22 – 2018-06-23 (×3): 650 mg via ORAL
  Filled 2018-06-22 (×4): qty 2

## 2018-06-22 MED ORDER — SODIUM CHLORIDE 0.9% FLUSH: 3.0000 mL | Freq: Once | INTRAVENOUS | Status: DC

## 2018-06-22 MED ORDER — SODIUM CHLORIDE 0.9 % IV BOLUS
1000.0000 mL | Freq: Once | INTRAVENOUS | Status: AC
  Administered 2018-06-22: 1000 mL via INTRAVENOUS

## 2018-06-22 MED ORDER — ONDANSETRON HCL 4 MG/2ML IJ SOLN
4.0000 mg | Freq: Once | INTRAMUSCULAR | Status: AC
  Administered 2018-06-22: 4 mg via INTRAVENOUS
  Filled 2018-06-22: qty 2

## 2018-06-22 MED ORDER — OSELTAMIVIR PHOSPHATE 75 MG PO CAPS
75.0000 mg | ORAL_CAPSULE | Freq: Once | ORAL | Status: AC
  Administered 2018-06-22: 75 mg via ORAL
  Filled 2018-06-22: qty 1

## 2018-06-22 MED ORDER — GUAIFENESIN ER 600 MG PO TB12
600.0000 mg | ORAL_TABLET | Freq: Two times a day (BID) | ORAL | Status: DC | PRN
  Administered 2018-06-22: 600 mg via ORAL
  Filled 2018-06-22 (×3): qty 1

## 2018-06-22 MED ORDER — POLYETHYLENE GLYCOL 3350 17 G PO PACK: 17.0000 g | PACK | Freq: Every day | ORAL | Status: DC | PRN

## 2018-06-22 MED ORDER — ACETAMINOPHEN 650 MG RE SUPP: 650.0000 mg | Freq: Four times a day (QID) | RECTAL | Status: DC | PRN

## 2018-06-22 MED ORDER — ADULT MULTIVITAMIN W/MINERALS CH
1.0000 | ORAL_TABLET | Freq: Every day | ORAL | Status: DC
  Administered 2018-06-23 – 2018-06-24 (×2): 1 via ORAL
  Filled 2018-06-22 (×3): qty 1

## 2018-06-22 MED ORDER — FOLIC ACID 1 MG PO TABS
1.0000 mg | ORAL_TABLET | Freq: Every day | ORAL | Status: DC
  Administered 2018-06-23 – 2018-06-24 (×2): 1 mg via ORAL
  Filled 2018-06-22 (×3): qty 1

## 2018-06-22 MED ORDER — MORPHINE SULFATE (PF) 4 MG/ML IV SOLN
4.0000 mg | Freq: Once | INTRAVENOUS | Status: AC
  Administered 2018-06-22: 4 mg via INTRAVENOUS
  Filled 2018-06-22: qty 1

## 2018-06-22 MED ORDER — SENNA 8.6 MG PO TABS
1.0000 | ORAL_TABLET | Freq: Two times a day (BID) | ORAL | Status: DC
  Administered 2018-06-23 – 2018-06-24 (×4): 8.6 mg via ORAL
  Filled 2018-06-22 (×4): qty 1

## 2018-06-22 MED ORDER — ONDANSETRON HCL 4 MG/2ML IJ SOLN: 4.0000 mg | Freq: Four times a day (QID) | INTRAMUSCULAR | Status: DC | PRN

## 2018-06-22 MED ORDER — IPRATROPIUM-ALBUTEROL 0.5-2.5 (3) MG/3ML IN SOLN
3.0000 mL | Freq: Once | RESPIRATORY_TRACT | Status: AC
  Administered 2018-06-22: 3 mL via RESPIRATORY_TRACT
  Filled 2018-06-22: qty 3

## 2018-06-22 MED ORDER — ALBUTEROL SULFATE (2.5 MG/3ML) 0.083% IN NEBU
2.5000 mg | INHALATION_SOLUTION | Freq: Four times a day (QID) | RESPIRATORY_TRACT | Status: DC | PRN
  Administered 2018-06-22 – 2018-06-23 (×3): 2.5 mg via RESPIRATORY_TRACT
  Filled 2018-06-22 (×4): qty 3

## 2018-06-22 NOTE — ED Provider Notes (Signed)
[facility] EMERGENCY DEPARTMENT Provider Note   CSN: [id number] Arrival date & time: 06/22/18  1636    History   Chief Complaint Chief Complaint  Patient presents with  . Chest Pain  . Cough    HPI Erica Farrell is a 28 y.o. female.     Pt presents to the ED today with sob and cough.  Pt said she's been sick for a few weeks.  She will be [redacted] weeks pregnant in a few days.  She has been to her obgyn Environmental manager in [place]) several times.  She's been on zofran, albuterol, prednisone, and zithromax.  She was improving, but has had to bring her son to the ED twice this week and is now feeling bad again.  She felt a pop in her right chest wall and now it is extremely painful to take a deep breath or to move.  The pt has not had a fever, but has had an elevated HR.  Pt said she's lost a lot of weight this pregnancy because she's not had an appetite.  Baby is doing well, however.  She has been going to her OB appts.     Past Medical History:  Diagnosis Date  . Asthma   . Depression   . Obesity     There are no active problems to display for this patient.   Past Surgical History:  Procedure Laterality Date  . DILATION AND CURETTAGE OF UTERUS       OB History    Gravida  3   Para  1   Term  1   Preterm      AB  1   Living  1     SAB      TAB      Ectopic      Multiple      Live Births  1            Home Medications    Prior to Admission medications   Medication Sig Start Date End Date Taking? Authorizing Provider  acetaminophen (TYLENOL) 325 MG tablet Take 325-650 mg every 6 (six) hours as needed by mouth (for pain or headaches).   Yes [provider]  albuterol (PROVENTIL) (2.5 MG/3ML) 0.083% nebulizer solution Take 2.5 mg by nebulization every 6 (six) hours as needed for wheezing or shortness of breath.   Yes [provider]  folic acid (FOLVITE) 1 MG tablet Take 1 mg by mouth daily.   Yes [provider]  guaiFENesin (MUCINEX) 600 MG 12 hr tablet Take 600 mg by mouth 2 (two) times daily as needed for cough or to loosen phlegm.    Yes [provider]  ondansetron (ZOFRAN-ODT) 4 MG disintegrating tablet Take 4 mg by mouth every 6 (six) hours as needed for nausea or vomiting.  06/09/18  Yes [provider]  polyethylene glycol powder (GLYCOLAX/MIRALAX) powder Take 17 g by mouth daily as needed for mild constipation.    Yes [provider]  PROAIR RESPICLICK 108 (90 Base) MCG/ACT AEPB Inhale 2 puffs into the lungs every 4 (four) hours as needed for wheezing. 06/12/18  Yes [provider]  ibuprofen (ADVIL,MOTRIN) 600 MG tablet Take 1 tablet (600 mg total) by mouth every 6 (six) hours as needed. Patient not taking: Reported on 06/22/2018 05/10/17   Aviva Signs, CNM  methylergonovine (METHERGINE) 0.2 MG tablet Take 1 tablet (0.2 mg total) by mouth every 6 (six) hours. Patient not taking: Reported  on 06/22/2018 05/10/17   Aviva Signs, CNM  ondansetron (ZOFRAN ODT) 8 MG disintegrating tablet Take 1 tablet (8 mg total) by mouth every 8 (eight) hours as needed. Patient not taking: Reported on 06/22/2018 10/02/16   Zadie Rhine, MD  Prenatal Vit w/Fe-Methylfol-FA (PNV PO) Take 1 capsule by mouth daily.    [provider]  traMADol (ULTRAM) 50 MG tablet Take 1 tablet (50 mg total) by mouth every 6 (six) hours as needed. Patient not taking: Reported on 06/22/2018 05/10/17   Aviva Signs, CNM    Family History No family history on file.  Social History Social History   Tobacco Use  . Smoking status: Former Games developer  . Smokeless tobacco: Never Used  Substance Use Topics  . Alcohol use: No  . Drug use: No     Allergies   Escitalopram; Lexapro [escitalopram oxalate]; Ademetionine; and Bupropion   Review of Systems Review of Systems  Constitutional: Positive for unexpected weight change.  Respiratory: Positive for cough, shortness of breath  and wheezing.   Gastrointestinal: Positive for nausea and vomiting.  All other systems reviewed and are negative.    Physical Exam Updated Vital Signs BP 113/65 (BP Location: Right Arm)   Pulse (!) 124   Temp 98.4 F (36.9 C) (Oral)   Resp (!) 22   SpO2 98%   Physical Exam Vitals signs and nursing note reviewed.  Constitutional:      Appearance: She is well-developed.  HENT:     Head: Normocephalic and atraumatic.  Eyes:     Extraocular Movements: Extraocular movements intact.     Pupils: Pupils are equal, round, and reactive to light.  Neck:     Musculoskeletal: Normal range of motion and neck supple.  Cardiovascular:     Rate and Rhythm: Regular rhythm. Tachycardia present.     Heart sounds: Normal heart sounds.  Pulmonary:     Effort: Tachypnea present.  Abdominal:     General: Bowel sounds are normal.     Palpations: Abdomen is soft.  Musculoskeletal: Normal range of motion.  Skin:    General: Skin is warm and dry.     Capillary Refill: Capillary refill takes less than 2 seconds.  Neurological:     General: No focal deficit present.     Mental Status: She is alert and oriented to person, place, and time.  Psychiatric:        Mood and Affect: Mood normal.        Behavior: Behavior normal.      ED Treatments / Results  Labs (all labs ordered are listed, but only abnormal results are displayed) Labs Reviewed  COMPREHENSIVE METABOLIC PANEL - Abnormal; Notable for the following components:      Result Value   Sodium 134 (*)    Potassium 3.2 (*)    CO2 16 (*)    BUN <5 (*)    Calcium 8.5 (*)    Total Protein 5.5 (*)    Albumin 2.7 (*)    AST 49 (*)    All other components within normal limits  CBC WITH DIFFERENTIAL/PLATELET - Abnormal; Notable for the following components:   WBC 12.6 (*)    Hemoglobin 11.6 (*)    HCT 35.9 (*)    Neutro Abs 9.8 (*)    Monocytes Absolute 1.2 (*)    Abs Immature Granulocytes 0.16 (*)    All other components within  normal limits  INFLUENZA PANEL BY PCR (TYPE A & B) -  Abnormal; Notable for the following components:   Influenza A By PCR POSITIVE (*)    All other components within normal limits  CULTURE, BLOOD (ROUTINE X 2)  CULTURE, BLOOD (ROUTINE X 2)  LACTIC ACID, PLASMA  LACTIC ACID, PLASMA  URINALYSIS, ROUTINE W REFLEX MICROSCOPIC    EKG EKG Interpretation  Date/Time:  Sunday June 22 2018 16:53:31 EST Ventricular Rate:  132 PR Interval:  116 QRS Duration: 76 QT Interval:  304 QTC Calculation: 450 R Axis:   45 Text Interpretation:  Sinus tachycardia Nonspecific ST and T wave abnormality Abnormal ECG Since last tracing rate faster Confirmed by Jacalyn Lefevre [phone]) on 06/22/2018 5:19:09 PM   Radiology Dg Ribs Unilateral W/chest Right  Result Date: 06/22/2018 CLINICAL DATA:  Cough, pain EXAM: RIGHT RIBS AND CHEST - 3+ VIEW COMPARISON:  None. FINDINGS: No fracture or other bone lesions are seen involving the ribs. There is no evidence of pneumothorax or pleural effusion. Both lungs are clear. Heart size and mediastinal contours are within normal limits. IMPRESSION: Negative. Electronically Signed   By: Charlett Nose M.D.   On: 06/22/2018 18:22    Procedures Procedures (including critical care time)  Medications Ordered in ED Medications  sodium chloride flush (NS) 0.9 % injection 3 mL (has no administration in time range)  sodium chloride 0.9 % bolus 1,000 mL (has no administration in time range)  morphine 4 MG/ML injection 4 mg (has no administration in time range)  oseltamivir (TAMIFLU) capsule 75 mg (has no administration in time range)  sodium chloride 0.9 % bolus 1,000 mL (1,000 mLs Intravenous New Bag/Given 06/22/18 1941)  ondansetron (ZOFRAN) injection 4 mg (4 mg Intravenous Given 06/22/18 1944)  ipratropium-albuterol (DUONEB) 0.5-2.5 (3) MG/3ML nebulizer solution 3 mL (3 mLs Nebulization Given 06/22/18 1829)  morphine 4 MG/ML injection 4 mg (4 mg Intravenous Given 06/22/18 1947)       Initial Impression / Assessment and Plan / ED Course  I have reviewed the triage vital signs and the nursing notes.  Pertinent labs & imaging results that were available during my care of the patient were reviewed by me and considered in my medical decision making (see chart for details).   I did do a bedside US and there was good fetal movement and good fetal heart tones.   Pt is still tachycardic and tachypneic after 2L NS.  She is positive for the flu.  She was d/w Dr. Despina [facility]) who asked that she be brought to [facility] specialty services.   Final Clinical Impressions(s) / ED Diagnoses   Final diagnoses:  Influenza A  [redacted] weeks gestation of pregnancy  Dehydration  Tachycardia  Rib pain  Non-intractable vomiting with nausea, unspecified vomiting type    ED Discharge Orders    None       Jacalyn Lefevre, MD 06/22/18 2124

## 2018-06-22 NOTE — ED Notes (Signed)
Pt requesting food at this time, beverage provided, pt denies nausea.

## 2018-06-22 NOTE — ED Triage Notes (Signed)
Pt reports worsening cough with L sided CP, denies dizziness, n/v.    Pt reports cough with URI ongoing for 5 weeks, reports finishing a Z pack approx 2 weeks ago.

## 2018-06-22 NOTE — ED Notes (Signed)
OB specialty called for ? Bed placement, charge will return call

## 2018-06-22 NOTE — ED Notes (Signed)
Patient transported to X-ray 

## 2018-06-22 NOTE — ED Notes (Signed)
Do not collect current labs, per MD [facility]

## 2018-06-22 NOTE — ED Notes (Signed)
Pt wants labs drawn when she is laying down.

## 2018-06-22 NOTE — ED Notes (Signed)
To x-ray

## 2018-06-22 NOTE — ED Notes (Signed)
Cough for 6 months pt c/o central chest pain  She coughed earlier today and felt a pop  In her lateral  Posterior chest alert no distress although she is hyperventilating  And has a anxiety disorder

## 2018-06-23 DIAGNOSIS — Z3A27 27 weeks gestation of pregnancy: Secondary | ICD-10-CM

## 2018-06-23 DIAGNOSIS — O26892 Other specified pregnancy related conditions, second trimester: Secondary | ICD-10-CM

## 2018-06-23 DIAGNOSIS — R000 Tachycardia, unspecified: Secondary | ICD-10-CM

## 2018-06-23 DIAGNOSIS — J101 Influenza due to other identified influenza virus with other respiratory manifestations: Secondary | ICD-10-CM

## 2018-06-23 LAB — BLOOD CULTURE ID PANEL (REFLEXED)
Acinetobacter baumannii: NOT DETECTED
CANDIDA GLABRATA: NOT DETECTED
Candida albicans: NOT DETECTED
Candida krusei: NOT DETECTED
Candida parapsilosis: NOT DETECTED
Candida tropicalis: NOT DETECTED
ENTEROBACTER CLOACAE COMPLEX: NOT DETECTED
Enterobacteriaceae species: NOT DETECTED
Enterococcus species: NOT DETECTED
Escherichia coli: NOT DETECTED
Haemophilus influenzae: NOT DETECTED
Klebsiella oxytoca: NOT DETECTED
Klebsiella pneumoniae: NOT DETECTED
LISTERIA MONOCYTOGENES: NOT DETECTED
Methicillin resistance: NOT DETECTED
Neisseria meningitidis: NOT DETECTED
PROTEUS SPECIES: NOT DETECTED
Pseudomonas aeruginosa: NOT DETECTED
Serratia marcescens: NOT DETECTED
Staphylococcus aureus (BCID): NOT DETECTED
Staphylococcus species: DETECTED — AB
Streptococcus agalactiae: NOT DETECTED
Streptococcus pneumoniae: NOT DETECTED
Streptococcus pyogenes: NOT DETECTED
Streptococcus species: NOT DETECTED

## 2018-06-23 LAB — URINALYSIS, ROUTINE W REFLEX MICROSCOPIC
Bilirubin Urine: NEGATIVE
Glucose, UA: 50 mg/dL — AB
Hgb urine dipstick: NEGATIVE
Ketones, ur: 20 mg/dL — AB
Leukocytes,Ua: NEGATIVE
Nitrite: NEGATIVE
PROTEIN: NEGATIVE mg/dL
Specific Gravity, Urine: 1.013 (ref 1.005–1.030)
pH: 6 (ref 5.0–8.0)

## 2018-06-23 LAB — CBC
HCT: 32 % — ABNORMAL LOW (ref 36.0–46.0)
Hemoglobin: 10.5 g/dL — ABNORMAL LOW (ref 12.0–15.0)
MCH: 28.2 pg (ref 26.0–34.0)
MCHC: 32.8 g/dL (ref 30.0–36.0)
MCV: 85.8 fL (ref 80.0–100.0)
Platelets: 188 10*3/uL (ref 150–400)
RBC: 3.73 MIL/uL — ABNORMAL LOW (ref 3.87–5.11)
RDW: 13.6 % (ref 11.5–15.5)
WBC: 10 10*3/uL (ref 4.0–10.5)
nRBC: 0 % (ref 0.0–0.2)

## 2018-06-23 LAB — BASIC METABOLIC PANEL
Anion gap: 8 (ref 5–15)
BUN: <5 mg/dL — ABNORMAL LOW (ref 6–20)
CO2: 19 mmol/L — ABNORMAL LOW (ref 22–32)
Calcium: 8.2 mg/dL — ABNORMAL LOW (ref 8.9–10.3)
Chloride: 109 mmol/L (ref 98–111)
Creatinine, Ser: 0.58 mg/dL (ref 0.44–1.00)
GFR calc Af Amer: >60 mL/min (ref >60)
GFR calc non Af Amer: >60 mL/min (ref >60)
Glucose, Bld: 113 mg/dL — ABNORMAL HIGH (ref 70–99)
Potassium: 3.3 mmol/L — ABNORMAL LOW (ref 3.5–5.1)
Sodium: 136 mmol/L (ref 135–145)

## 2018-06-23 LAB — HIV ANTIBODY (ROUTINE TESTING W REFLEX): HIV SCREEN 4TH GENERATION: NONREACTIVE

## 2018-06-23 MED ORDER — DEXTROSE IN LACTATED RINGERS 5 % IV SOLN
INTRAVENOUS | Status: DC
  Administered 2018-06-23 – 2018-06-24 (×5): via INTRAVENOUS

## 2018-06-23 MED ORDER — METOCLOPRAMIDE HCL 10 MG PO TABS
10.0000 mg | ORAL_TABLET | Freq: Three times a day (TID) | ORAL | Status: DC
  Administered 2018-06-23 – 2018-06-24 (×4): 10 mg via ORAL
  Filled 2018-06-23 (×4): qty 1

## 2018-06-23 MED ORDER — GUAIFENESIN-CODEINE 100-10 MG/5ML PO SOLN
10.0000 mL | Freq: Four times a day (QID) | ORAL | Status: DC | PRN
  Administered 2018-06-23 – 2018-06-24 (×2): 10 mL via ORAL
  Filled 2018-06-23 (×4): qty 10

## 2018-06-23 MED ORDER — PANTOPRAZOLE SODIUM 40 MG PO TBEC
40.0000 mg | DELAYED_RELEASE_TABLET | Freq: Every day | ORAL | Status: DC
  Administered 2018-06-23 – 2018-06-24 (×2): 40 mg via ORAL
  Filled 2018-06-23 (×2): qty 1

## 2018-06-23 MED ORDER — OSELTAMIVIR PHOSPHATE 75 MG PO CAPS
75.0000 mg | ORAL_CAPSULE | Freq: Two times a day (BID) | ORAL | Status: DC
  Administered 2018-06-23 – 2018-06-24 (×4): 75 mg via ORAL
  Filled 2018-06-23 (×5): qty 1

## 2018-06-23 NOTE — Progress Notes (Signed)
PHARMACY - PHYSICIAN COMMUNICATION CRITICAL VALUE ALERT - BLOOD CULTURE IDENTIFICATION (BCID)  Erica Farrell is an 28 y.o. female who presented to [facility] on 06/22/2018 with a chief complaint of Influenza A and weight loss in preganancy at [redacted]w[redacted]d.  Assessment:  1/4 Blood cultures CoNS (include suspected source if known)  Name of physician (or Provider) Contacted: Nettie Elm  Current antibiotics: Currently on Tamiflu  Changes to prescribed antibiotics recommended:  Dr Alysia Penna to assess pt clinically and evaluate blood cultures pending  Results for orders placed or performed during the hospital encounter of 06/22/18  Blood Culture ID Panel (Reflexed) (Collected: 06/22/2018  7:10 PM)  Result Value Ref Range   Enterococcus species NOT DETECTED NOT DETECTED   Listeria monocytogenes NOT DETECTED NOT DETECTED   Staphylococcus species DETECTED (A) NOT DETECTED   Staphylococcus aureus (BCID) NOT DETECTED NOT DETECTED   Methicillin resistance NOT DETECTED NOT DETECTED   Streptococcus species NOT DETECTED NOT DETECTED   Streptococcus agalactiae NOT DETECTED NOT DETECTED   Streptococcus pneumoniae NOT DETECTED NOT DETECTED   Streptococcus pyogenes NOT DETECTED NOT DETECTED   Acinetobacter baumannii NOT DETECTED NOT DETECTED   Enterobacteriaceae species NOT DETECTED NOT DETECTED   Enterobacter cloacae complex NOT DETECTED NOT DETECTED   Escherichia coli NOT DETECTED NOT DETECTED   Klebsiella oxytoca NOT DETECTED NOT DETECTED   Klebsiella pneumoniae NOT DETECTED NOT DETECTED   Proteus species NOT DETECTED NOT DETECTED   Serratia marcescens NOT DETECTED NOT DETECTED   Haemophilus influenzae NOT DETECTED NOT DETECTED   Neisseria meningitidis NOT DETECTED NOT DETECTED   Pseudomonas aeruginosa NOT DETECTED NOT DETECTED   Candida albicans NOT DETECTED NOT DETECTED   Candida glabrata NOT DETECTED NOT DETECTED   Candida krusei NOT DETECTED NOT DETECTED   Candida parapsilosis NOT DETECTED NOT  DETECTED   Candida tropicalis NOT DETECTED NOT DETECTED    Claybon Jabs 06/23/2018  7:52 PM

## 2018-06-23 NOTE — Progress Notes (Signed)
Patient ID: Erica Farrell, female   DOB: 1991/01/04, 28 y.o.   MRN: [id number] FACULTY PRACTICE ANTEPARTUM(COMPREHENSIVE) NOTE  Erica Farrell is a 28 y.o. G3P1011 with Estimated Date of Delivery: 09/24/18   By   [redacted]w[redacted]d  who is admitted for +Influenza A with tachypnea with negative CXR.    Fetal presentation is unsure. Length of Stay:  1  Days  Date of admission:06/22/2018  Subjective: Followed for pNC by [facility] in [place] Pt has history of asthma, recently treated for bronchitis with zithromax and prednisone and inhalers Presents with tachypnea and +influenza A swab Also with history of N/V weight loss for this pregnancy Hx anencephaly with previous pregnancy and also a vaginal delivery at term Patient reports the fetal movement as active. Patient reports uterine contraction  activity as none. Patient reports  vaginal bleeding as none. Patient describes fluid per vagina as None.  Vitals:  Blood pressure (!) 102/52, pulse (!) 120, temperature 97.8 F (36.6 C), temperature source Oral, resp. rate (!) 24, SpO2 99 %. Vitals:   06/22/18 2347 06/23/18 0219 06/23/18 0606 06/23/18 0637  BP:  (!) 109/53  (!) 102/52  Pulse:  (!) 124  (!) 120  Resp:  (!) 24    Temp:  97.6 F (36.4 C)  97.8 F (36.6 C)  TempSrc:  Oral  Oral  SpO2: 99% 98% 98% 99%   Physical Examination:  General appearance - alert, well appearing, and in no distress Chest - clear to auscultation, no wheezes, rales or rhonchi, symmetric air entry Abdomen - soft, nontender, nondistended, no masses or organomegaly Fundal Height:  size equals dates Pelvic Exam:  {deferred Extremities: extremities normal, atraumatic, no cyanosis or edema with DTRs 2+ bilaterally Membranes:intact  Fetal Monitoring:  Reassuring tracing     Labs:  Results for orders placed or performed during the hospital encounter of 06/22/18 (from the past 24 hour(s))  Lactic acid, plasma   Collection Time: 06/22/18  7:06 PM  Result Value Ref Range   Lactic Acid, Venous 1.7 0.5 - 1.9 mmol/L  Comprehensive metabolic panel   Collection Time: 06/22/18  7:07 PM  Result Value Ref Range   Sodium 134 (L) 135 - 145 mmol/L   Potassium 3.2 (L) 3.5 - 5.1 mmol/L   Chloride 107 98 - 111 mmol/L   CO2 16 (L) 22 - 32 mmol/L   Glucose, Bld 94 70 - 99 mg/dL   BUN <5 (L) 6 - 20 mg/dL   Creatinine, Ser 3.64 0.44 - 1.00 mg/dL   Calcium 8.5 (L) 8.9 - 10.3 mg/dL   Total Protein 5.5 (L) 6.5 - 8.1 g/dL   Albumin 2.7 (L) 3.5 - 5.0 g/dL   AST 49 (H) 15 - 41 U/L   ALT 33 0 - 44 U/L   Alkaline Phosphatase 74 38 - 126 U/L   Total Bilirubin 0.3 0.3 - 1.2 mg/dL   GFR calc non Af Amer >60 >60 mL/min   GFR calc Af Amer >60 >60 mL/min   Anion gap 11 5 - 15  CBC with Differential   Collection Time: 06/22/18  7:07 PM  Result Value Ref Range   WBC 12.6 (H) 4.0 - 10.5 K/uL   RBC 4.19 3.87 - 5.11 MIL/uL   Hemoglobin 11.6 (L) 12.0 - 15.0 g/dL   HCT 68.0 (L) 32.1 - 22.4 %   MCV 85.7 80.0 - 100.0 fL   MCH 27.7 26.0 - 34.0 pg   MCHC 32.3 30.0 - 36.0 g/dL   RDW 82.5 00.3 -  15.5 %   Platelets 208 150 - 400 K/uL   nRBC 0.0 0.0 - 0.2 %   Neutrophils Relative % 78 %   Neutro Abs 9.8 (H) 1.7 - 7.7 K/uL   Lymphocytes Relative 10 %   Lymphs Abs 1.2 0.7 - 4.0 K/uL   Monocytes Relative 10 %   Monocytes Absolute 1.2 (H) 0.1 - 1.0 K/uL   Eosinophils Relative 1 %   Eosinophils Absolute 0.1 0.0 - 0.5 K/uL   Basophils Relative 0 %   Basophils Absolute 0.0 0.0 - 0.1 K/uL   Immature Granulocytes 1 %   Abs Immature Granulocytes 0.16 (H) 0.00 - 0.07 K/uL  Influenza panel by PCR (type A & B)   Collection Time: 06/22/18  7:50 PM  Result Value Ref Range   Influenza A By PCR POSITIVE (A) NEGATIVE   Influenza B By PCR NEGATIVE NEGATIVE  Urinalysis, Routine w reflex microscopic   Collection Time: 06/23/18  2:22 AM  Result Value Ref Range   Color, Urine YELLOW YELLOW   APPearance CLEAR CLEAR   Specific Gravity, Urine 1.013 1.005 - 1.030   pH 6.0 5.0 - 8.0   Glucose, UA 50  (A) NEGATIVE mg/dL   Hgb urine dipstick NEGATIVE NEGATIVE   Bilirubin Urine NEGATIVE NEGATIVE   Ketones, ur 20 (A) NEGATIVE mg/dL   Protein, ur NEGATIVE NEGATIVE mg/dL   Nitrite NEGATIVE NEGATIVE   Leukocytes,Ua NEGATIVE NEGATIVE  Basic metabolic panel   Collection Time: 06/23/18  6:13 AM  Result Value Ref Range   Sodium 136 135 - 145 mmol/L   Potassium 3.3 (L) 3.5 - 5.1 mmol/L   Chloride 109 98 - 111 mmol/L   CO2 19 (L) 22 - 32 mmol/L   Glucose, Bld 113 (H) 70 - 99 mg/dL   BUN <5 (L) 6 - 20 mg/dL   Creatinine, Ser 1.61 0.44 - 1.00 mg/dL   Calcium 8.2 (L) 8.9 - 10.3 mg/dL   GFR calc non Af Amer >60 >60 mL/min   GFR calc Af Amer >60 >60 mL/min   Anion gap 8 5 - 15  CBC   Collection Time: 06/23/18  6:13 AM  Result Value Ref Range   WBC 10.0 4.0 - 10.5 K/uL   RBC 3.73 (L) 3.87 - 5.11 MIL/uL   Hemoglobin 10.5 (L) 12.0 - 15.0 g/dL   HCT 09.6 (L) 04.5 - 40.9 %   MCV 85.8 80.0 - 100.0 fL   MCH 28.2 26.0 - 34.0 pg   MCHC 32.8 30.0 - 36.0 g/dL   RDW 81.1 91.4 - 78.2 %   Platelets 188 150 - 400 K/uL   nRBC 0.0 0.0 - 0.2 %    Imaging Studies:      Medications:  Scheduled . folic acid  1 mg Oral Daily  . metoCLOPramide  10 mg Oral TID AC  . multivitamin with minerals  1 tablet Oral Daily  . oseltamivir  75 mg Oral BID  . pantoprazole  40 mg Oral Daily  . senna  1 tablet Oral BID  . sodium chloride flush  3 mL Intravenous Once   I have reviewed the patient's current medications.  ASSESSMENT: [id number] [redacted]w[redacted]d Estimated Date of Delivery: 09/24/18  [redacted]w[redacted]d  Patient Active Problem List   Diagnosis Date Noted  . Influenza with respiratory manifestation 06/22/2018  Weight loss of pregnancy PNC with [facility] in [place]  PLAN: >Hydration and observation due to tachypnea and tachycardia >add reglan to protonix to see if can help her apetite  and N/V issues  Amaryllis Dyke Eure 06/23/2018,7:57 AM

## 2018-06-23 NOTE — H&P (Signed)
History              Chief Complaint    Chief Complaint  Patient presents with  . Chest Pain  . Cough    HPI Erica Farrell is a 28 y.o. female.   Pt presents to the ED today with sob and cough.  Pt said she's been sick for a few weeks.  She will be [redacted] weeks pregnant in a few days.  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  She's been on zofran, albuterol, prednisone, and zithromax.  She was improving, but has had to bring her son to the ED twice this week and is now feeling bad again.  She felt a pop in her right chest wall and now it is extremely painful to take a deep breath or to move.  The pt has not had a fever, but has had an elevated HR.  Pt said she's lost a lot of weight this pregnancy because she's not had an appetite.  Baby is doing well, however.  She has been going to her OB appts.         Past Medical History:  Diagnosis Date  . Asthma   . Depression   . Obesity     There are no active problems to display for this patient.        Past Surgical History:  Procedure Laterality Date  . DILATION AND CURETTAGE OF UTERUS                          OB History    Gravida  3   Para  1   Term  1   Preterm      AB  1   Living  1     SAB      TAB      Ectopic      Multiple      Live Births  1            Home Medications                      Prior to Admission medications   Medication Sig Start Date End Date Taking? Authorizing Provider  acetaminophen (TYLENOL) 325 MG tablet Take 325-650 mg every 6 (six) hours as needed by mouth (for pain or headaches).   Yes [provider]  albuterol (PROVENTIL) (2.5 MG/3ML) 0.083% nebulizer solution Take 2.5 mg by nebulization every 6 (six) hours as needed for wheezing or shortness of breath.   Yes [provider]  folic acid (FOLVITE) 1 MG tablet Take 1 mg by mouth daily.   Yes [provider]  guaiFENesin (MUCINEX) 600 MG  12 hr tablet Take 600 mg by mouth 2 (two) times daily as needed for cough or to loosen phlegm.    Yes [provider]  ondansetron (ZOFRAN-ODT) 4 MG disintegrating tablet Take 4 mg by mouth every 6 (six) hours as needed for nausea or vomiting.  06/09/18  Yes [provider]  polyethylene glycol powder (GLYCOLAX/MIRALAX) powder Take 17 g by mouth daily as needed for mild constipation.    Yes [provider]  PROAIR RESPICLICK 108 (90 Base) MCG/ACT AEPB Inhale 2 puffs into the lungs every 4 (four) hours as needed for wheezing. 06/12/18  Yes [provider]  ibuprofen (ADVIL,MOTRIN) 600 MG tablet Take 1 tablet (600 mg total) by mouth every 6 (six) hours as  needed. Patient not taking: Reported on 06/22/2018 05/10/17   Aviva Signs, CNM  methylergonovine (METHERGINE) 0.2 MG tablet Take 1 tablet (0.2 mg total) by mouth every 6 (six) hours. Patient not taking: Reported on 06/22/2018 05/10/17   Aviva Signs, CNM  ondansetron (ZOFRAN ODT) 8 MG disintegrating tablet Take 1 tablet (8 mg total) by mouth every 8 (eight) hours as needed. Patient not taking: Reported on 06/22/2018 10/02/16   Zadie Rhine, MD  Prenatal Vit w/Fe-Methylfol-FA (PNV PO) Take 1 capsule by mouth daily.    [provider]  traMADol (ULTRAM) 50 MG tablet Take 1 tablet (50 mg total) by mouth every 6 (six) hours as needed. Patient not taking: Reported on 06/22/2018 05/10/17   Aviva Signs, CNM    Family History No family history on file.  Social History Social History       Tobacco Use  . Smoking status: Former Games developer  . Smokeless tobacco: Never Used  Substance Use Topics  . Alcohol use: No  . Drug use: No     Allergies              Escitalopram; Lexapro [escitalopram oxalate]; Ademetionine; and Bupropion   Review of Systems Review of Systems  Constitutional: Positive for unexpected weight change.  Respiratory: Positive for cough, shortness of  breath and wheezing.   Gastrointestinal: Positive for nausea and vomiting.  All other systems reviewed and are negative.    Physical Exam Updated Vital Signs BP 113/65 (BP Location: Right Arm)   Pulse (!) 124   Temp 98.4 F (36.9 C) (Oral)   Resp (!) 22   SpO2 98%   Physical Exam Vitals signs and nursing note reviewed.  Constitutional:      Appearance: She is well-developed.  HENT:     Head: Normocephalic and atraumatic.  Eyes:     Extraocular Movements: Extraocular movements intact.     Pupils: Pupils are equal, round, and reactive to light.  Neck:     Musculoskeletal: Normal range of motion and neck supple.  Cardiovascular:     Rate and Rhythm: Regular rhythm. Tachycardia present.     Heart sounds: Normal heart sounds.  Pulmonary:     Effort: Tachypnea present.  Abdominal:     General: Bowel sounds are normal.     Palpations: Abdomen is soft.  Musculoskeletal: Normal range of motion.  Skin:    General: Skin is warm and dry.     Capillary Refill: Capillary refill takes less than 2 seconds.  Neurological:     General: No focal deficit present.     Mental Status: She is alert and oriented to person, place, and time.  Psychiatric:        Mood and Affect: Mood normal.        Behavior: Behavior normal.      ED Treatments / Results  Labs (all labs ordered are listed, but only abnormal results are displayed)      Labs Reviewed  COMPREHENSIVE METABOLIC PANEL - Abnormal; Notable for the following components:      Result Value    Sodium 134 (*)    Potassium 3.2 (*)    CO2 16 (*)    BUN <5 (*)    Calcium 8.5 (*)    Total Protein 5.5 (*)    Albumin 2.7 (*)    AST 49 (*)    All other components within normal limits  CBC WITH DIFFERENTIAL/PLATELET - Abnormal; Notable for the following components:  WBC 12.6 (*)    Hemoglobin 11.6 (*)    HCT 35.9 (*)    Neutro Abs 9.8 (*)    Monocytes Absolute 1.2 (*)    Abs Immature  Granulocytes 0.16 (*)    All other components within normal limits  INFLUENZA PANEL BY PCR (TYPE A & B) - Abnormal; Notable for the following components:   Influenza A By PCR POSITIVE (*)    All other components within normal limits  CULTURE, BLOOD (ROUTINE X 2)  CULTURE, BLOOD (ROUTINE X 2)  LACTIC ACID, PLASMA  LACTIC ACID, PLASMA  URINALYSIS, ROUTINE W REFLEX MICROSCOPIC    EKG EKG Interpretation  Date/Time:                   Text Interpretation:       Sinus tachycardia Nonspecific ST and T wave abnormality Abnormal ECG Since last tracing rate faster Confirmed by Jacalyn Lefevre [phone]) on 06/22/2018 5:19:09 PM   Radiology  Imaging Results (Last 48 hours)  Dg Ribs Unilateral W/chest Right  Result Date: 06/22/2018 CLINICAL DATA:  Cough, pain EXAM: RIGHT RIBS AND CHEST - 3+ VIEW COMPARISON:  None. FINDINGS: No fracture or other bone lesions are seen involving the ribs. There is no evidence of pneumothorax or pleural effusion. Both lungs are clear. Heart size and mediastinal contours are within normal limits. IMPRESSION: Negative. Electronically Signed   By: Charlett Nose M.D.   On: 06/22/2018 18:22     Procedures Procedures (including critical care time)  Medications Ordered in ED Medications  sodium chloride flush (NS) 0.9 % injection 3 mL (has no administration in time range)  sodium chloride 0.9 % bolus 1,000 mL (has no administration in time range)  morphine 4 MG/ML injection 4 mg (has no administration in time range)  oseltamivir (TAMIFLU) capsule 75 mg (has no administration in time range)  sodium chloride 0.9 % bolus 1,000 mL (1,000 mLs Intravenous New Bag/Given 06/22/18 1941)  ondansetron (ZOFRAN) injection 4 mg (4 mg Intravenous Given 06/22/18  1944)  ipratropium-albuterol (DUONEB) 0.5-2.5 (3) MG/3ML nebulizer solution 3 mL (3 mLs Nebulization Given 06/22/18 1829)  morphine 4 MG/ML injection 4 mg (4 mg Intravenous Given 06/22/18 1947)     Initial Impression / Assessment and Plan / ED Course  I have reviewed the triage vital signs and the nursing notes.  Pertinent labs & imaging results that were available during my care of the patient were reviewed by me and considered in my medical decision making (see chart for details).   I did do a bedside US and there was good fetal movement and good fetal heart tones.   Pt is still tachycardic and tachypneic after 2L NS.  She is positive for the flu.  She was d/w Dr. Despina Hidden [facility]) who asked that she be brought to [facility] specialty services.   Final Clinical Impressions(s) / ED Diagnoses   Final diagnoses:  Influenza A  [redacted] weeks gestation of  pregnancy  Dehydration  Tachycardia  Rib pain  Non-intractable vomiting with nausea, unspecified vomiting type       ED Discharge Orders    None       Jacalyn Lefevre, MD 06/22/18 2124   Lazaro Arms, MD

## 2018-06-23 NOTE — Progress Notes (Signed)
Initial visit with Keaja to introduce spiritual care and offer support while she's in the hospital>  She reports she's grateful to be getting some fluids as she felt like her health wasn't taken seriously when she was at another facility and she's been unable to really eat over the last several weeks.  She can already see some improvement in her health and is hopeful she'll go home tomorrow.  Please page as further needs arise.  Maryanna Shape. Carley Hammed, M.Div. [facility] Chaplain Pager [phone] Office [phone]

## 2018-06-24 MED ORDER — OSELTAMIVIR PHOSPHATE 75 MG PO CAPS: 75.0000 mg | ORAL_CAPSULE | Freq: Two times a day (BID) | ORAL | 0 refills | Status: DC

## 2018-06-24 MED ORDER — PANTOPRAZOLE SODIUM 40 MG PO TBEC: 40.0000 mg | DELAYED_RELEASE_TABLET | Freq: Every day | ORAL | 1 refills | Status: DC

## 2018-06-24 MED ORDER — BECLOMETHASONE DIPROP HFA 40 MCG/ACT IN AERB: 1.0000 | INHALATION_SPRAY | Freq: Two times a day (BID) | RESPIRATORY_TRACT | 1 refills | Status: AC

## 2018-06-24 MED ORDER — METOCLOPRAMIDE HCL 10 MG PO TABS: 10.0000 mg | ORAL_TABLET | Freq: Three times a day (TID) | ORAL | 1 refills | Status: DC

## 2018-06-24 NOTE — Discharge Summary (Signed)
Patient ID: Erica Farrell MRN: [id number] DOB/AGE: May 02, 1990 28 y.o.  Admit date: 06/22/2018 Discharge date: 06/24/2018  Admission Diagnoses: Patient Active Problem List   Diagnosis Date Noted  . Influenza with respiratory manifestation 06/22/2018  Asthma [redacted]w[redacted]d  Discharge Diagnoses: same  Prenatal Procedures: none  Consults: none  Hospital Course:  This is a 28 y.o. G3P1011 with IUP at [redacted]w[redacted]d admitted for cough and SOB with asthma and influenza A. She has been to her obgyn Environmental manager in [place]) several times. She's been on zofran, albuterol, prednisone, and zithromax. She was improving, but has had to bring her son to the ED twice this week and is now feeling bad again. She felt a pop in her right chest wall and now it is extremely painful to take a deep breath or to move. The pt has not had a fever, but has had an elevated HR. Pt said she's lost a lot of weight this pregnancy because she's not had an appetite. Baby is doing well, however. She has been going to her OB appts. She was afebrile and her SOB improved with nebulizer treatments and her nausea improved with Protonix and Reglan. She received Tamiflu for 2 days  Discharge Exam: Temp:  [97.7 F (36.5 C)-98.4 F (36.9 C)] 97.9 F (36.6 C) (02/25 0743) Pulse Rate:  [96-121] 115 (02/25 0743) Resp:  [18] 18 (02/25 0743) BP: (101-112)/(55-74) 108/58 (02/25 0743) SpO2:  [98 %-100 %] 98 % (02/25 0743) Weight:  [77.1 kg] 77.1 kg (02/24 1935) Physical Examination: CONSTITUTIONAL: Well-developed, well-nourished female in no acute distress.  HENT:  Normocephalic, atraumatic, External right and left ear normal. Oropharynx is clear and moist EYES: Conjunctivae and EOM are normal. Pupils are equal, round, and reactive to light. No scleral icterus.  NECK: Normal range of motion, supple, no masses SKIN: Skin is warm and dry. No rash noted. Not diaphoretic. No erythema. No pallor. NEUROLGIC: Alert and oriented to person, place,  and time. Normal reflexes, muscle tone coordination. No cranial nerve deficit noted. PSYCHIATRIC: Normal mood and affect. Normal behavior. Normal judgment and thought content. CARDIOVASCULAR: Normal heart rate noted, regular rhythm RESPIRATORY: Effort and breath sounds normal, no problems with respiration noted MUSCULOSKELETAL: Normal range of motion. No edema and no tenderness. 2+ distal pulses. ABDOMEN: Soft, nontender, nondistended, gravid. CERVIX:    Fetal Heart Rate A  Mode External filed at 06/23/2018 2340  Baseline Rate (A) 145 bpm filed at 06/23/2018 2340  Variability 6-25 BPM filed at 06/23/2018 2340  Accelerations 15 x 15 filed at 06/23/2018 2340  Decelerations None filed at 06/23/2018 2340     Significant Diagnostic Studies:  Results for orders placed or performed during the hospital encounter of 06/22/18 (from the past 168 hour(s))  Lactic acid, plasma   Collection Time: 06/22/18  7:06 PM  Result Value Ref Range   Lactic Acid, Venous 1.7 0.5 - 1.9 mmol/L  Comprehensive metabolic panel   Collection Time: 06/22/18  7:07 PM  Result Value Ref Range   Sodium 134 (L) 135 - 145 mmol/L   Potassium 3.2 (L) 3.5 - 5.1 mmol/L   Chloride 107 98 - 111 mmol/L   CO2 16 (L) 22 - 32 mmol/L   Glucose, Bld 94 70 - 99 mg/dL   BUN <5 (L) 6 - 20 mg/dL   Creatinine, Ser 4.09 0.44 - 1.00 mg/dL   Calcium 8.5 (L) 8.9 - 10.3 mg/dL   Total Protein 5.5 (L) 6.5 - 8.1 g/dL   Albumin 2.7 (L) 3.5 - 5.0 g/dL  AST 49 (H) 15 - 41 U/L   ALT 33 0 - 44 U/L   Alkaline Phosphatase 74 38 - 126 U/L   Total Bilirubin 0.3 0.3 - 1.2 mg/dL   GFR calc non Af Amer >60 >60 mL/min   GFR calc Af Amer >60 >60 mL/min   Anion gap 11 5 - 15  CBC with Differential   Collection Time: 06/22/18  7:07 PM  Result Value Ref Range   WBC 12.6 (H) 4.0 - 10.5 K/uL   RBC 4.19 3.87 - 5.11 MIL/uL   Hemoglobin 11.6 (L) 12.0 - 15.0 g/dL   HCT 17.7 (L) 93.9 - 03.0 %   MCV 85.7 80.0 - 100.0 fL   MCH 27.7 26.0 - 34.0 pg   MCHC  32.3 30.0 - 36.0 g/dL   RDW 09.2 33.0 - 07.6 %   Platelets 208 150 - 400 K/uL   nRBC 0.0 0.0 - 0.2 %   Neutrophils Relative % 78 %   Neutro Abs 9.8 (H) 1.7 - 7.7 K/uL   Lymphocytes Relative 10 %   Lymphs Abs 1.2 0.7 - 4.0 K/uL   Monocytes Relative 10 %   Monocytes Absolute 1.2 (H) 0.1 - 1.0 K/uL   Eosinophils Relative 1 %   Eosinophils Absolute 0.1 0.0 - 0.5 K/uL   Basophils Relative 0 %   Basophils Absolute 0.0 0.0 - 0.1 K/uL   Immature Granulocytes 1 %   Abs Immature Granulocytes 0.16 (H) 0.00 - 0.07 K/uL  Culture, blood (Routine x 2)   Collection Time: 06/22/18  7:10 PM  Result Value Ref Range   Specimen Description BLOOD LEFT ANTECUBITAL    Special Requests      BOTTLES DRAWN AEROBIC AND ANAEROBIC Blood Culture adequate volume   Culture  Setup Time      ANAEROBIC BOTTLE ONLY GRAM POSITIVE COCCI CRITICAL RESULT CALLED TO, READ BACK BY AND VERIFIED WITHCarlynn Herald [facility] 06/23/18 1924 JDW Performed at [facility], [address], [place], [place] [address]    Culture GRAM POSITIVE COCCI    Report Status PENDING   Blood Culture ID Panel (Reflexed)   Collection Time: 06/22/18  7:10 PM  Result Value Ref Range   Enterococcus species NOT DETECTED NOT DETECTED   Listeria monocytogenes NOT DETECTED NOT DETECTED   Staphylococcus species DETECTED (A) NOT DETECTED   Staphylococcus aureus (BCID) NOT DETECTED NOT DETECTED   Methicillin resistance NOT DETECTED NOT DETECTED   Streptococcus species NOT DETECTED NOT DETECTED   Streptococcus agalactiae NOT DETECTED NOT DETECTED   Streptococcus pneumoniae NOT DETECTED NOT DETECTED   Streptococcus pyogenes NOT DETECTED NOT DETECTED   Acinetobacter baumannii NOT DETECTED NOT DETECTED   Enterobacteriaceae species NOT DETECTED NOT DETECTED   Enterobacter cloacae complex NOT DETECTED NOT DETECTED   Escherichia coli NOT DETECTED NOT DETECTED   Klebsiella oxytoca NOT DETECTED NOT DETECTED   Klebsiella pneumoniae NOT DETECTED NOT DETECTED    Proteus species NOT DETECTED NOT DETECTED   Serratia marcescens NOT DETECTED NOT DETECTED   Haemophilus influenzae NOT DETECTED NOT DETECTED   Neisseria meningitidis NOT DETECTED NOT DETECTED   Pseudomonas aeruginosa NOT DETECTED NOT DETECTED   Candida albicans NOT DETECTED NOT DETECTED   Candida glabrata NOT DETECTED NOT DETECTED   Candida krusei NOT DETECTED NOT DETECTED   Candida parapsilosis NOT DETECTED NOT DETECTED   Candida tropicalis NOT DETECTED NOT DETECTED  Influenza panel by PCR (type A & B)   Collection Time: 06/22/18  7:50 PM  Result Value Ref Range   Influenza A By PCR POSITIVE (A) NEGATIVE   Influenza B By PCR NEGATIVE NEGATIVE  HIV antibody (Routine Testing)   Collection Time: 06/22/18 11:49 PM  Result Value Ref Range   HIV Screen 4th Generation wRfx Non Reactive Non Reactive  Urinalysis, Routine w reflex microscopic   Collection Time: 06/23/18  2:22 AM  Result Value Ref Range   Color, Urine YELLOW YELLOW   APPearance CLEAR CLEAR   Specific Gravity, Urine 1.013 1.005 - 1.030   pH 6.0 5.0 - 8.0   Glucose, UA 50 (A) NEGATIVE mg/dL   Hgb urine dipstick NEGATIVE NEGATIVE   Bilirubin Urine NEGATIVE NEGATIVE   Ketones, ur 20 (A) NEGATIVE mg/dL   Protein, ur NEGATIVE NEGATIVE mg/dL   Nitrite NEGATIVE NEGATIVE   Leukocytes,Ua NEGATIVE NEGATIVE  Basic metabolic panel   Collection Time: 06/23/18  6:13 AM  Result Value Ref Range   Sodium 136 135 - 145 mmol/L   Potassium 3.3 (L) 3.5 - 5.1 mmol/L   Chloride 109 98 - 111 mmol/L   CO2 19 (L) 22 - 32 mmol/L   Glucose, Bld 113 (H) 70 - 99 mg/dL   BUN <5 (L) 6 - 20 mg/dL   Creatinine, Ser 1.61 0.44 - 1.00 mg/dL   Calcium 8.2 (L) 8.9 - 10.3 mg/dL   GFR calc non Af Amer >60 >60 mL/min   GFR calc Af Amer >60 >60 mL/min   Anion gap 8 5 - 15  CBC   Collection Time: 06/23/18  6:13 AM  Result Value Ref Range   WBC 10.0 4.0 - 10.5 K/uL   RBC 3.73 (L) 3.87 - 5.11 MIL/uL   Hemoglobin 10.5 (L) 12.0 - 15.0 g/dL   HCT 09.6  (L) 04.5 - 46.0 %   MCV 85.8 80.0 - 100.0 fL   MCH 28.2 26.0 - 34.0 pg   MCHC 32.8 30.0 - 36.0 g/dL   RDW 40.9 81.1 - 91.4 %   Platelets 188 150 - 400 K/uL   nRBC 0.0 0.0 - 0.2 %   EKG Interpretation  Date/Time:Sunday June 22 2018 16:53:31 EST Ventricular Rate: 132 PR Interval:116 QRS Duration:76 QT Interval:304 QTC Calculation:450 R Axis:45 Text Interpretation:Sinus tachycardia Nonspecific ST and T wave abnormality Abnormal ECG Since last tracing rate faster Confirmed by Jacalyn Lefevre [id number]) on 06/22/2018 5:19:09 PM  CLINICAL DATA:  Cough, pain  EXAM: RIGHT RIBS AND CHEST - 3+ VIEW  COMPARISON:  None.  FINDINGS: No fracture or other bone lesions are seen involving the ribs. There is no evidence of pneumothorax or pleural effusion. Both lungs are clear. Heart size and mediastinal contours are within normal limits.  IMPRESSION: Negative.   Electronically Signed   By: Charlett Nose M.D.   On: 06/22/2018 18:22  Discharge Condition: Stable  Disposition: Discharge disposition: 01-Home or Self Care        Discharge Instructions    Discharge patient   Complete by:  As directed    Discharge disposition:  01-Home or Self Care   Discharge patient date:  06/24/2018     Allergies as of 06/24/2018      Reactions   Escitalopram Anaphylaxis, Shortness Of Breath   "Felt like throat was closing"- had to take Benadryl   Lexapro [escitalopram Oxalate] Anaphylaxis, Shortness Of Breath   Felt like throat was closing (at home/took Benadryl)   Ademetionine Hives, Rash   Bupropion Rash   "Once it builds up in my system"  Medication List    STOP taking these medications   acetaminophen 325 MG tablet Commonly known as:  TYLENOL   folic acid 1 MG tablet Commonly known as:  FOLVITE   guaiFENesin 600 MG 12 hr tablet Commonly known as:  MUCINEX    ibuprofen 600 MG tablet Commonly known as:  ADVIL,MOTRIN   methylergonovine 0.2 MG tablet Commonly known as:  METHERGINE   ondansetron 4 MG disintegrating tablet Commonly known as:  ZOFRAN-ODT   ondansetron 8 MG disintegrating tablet Commonly known as:  ZOFRAN ODT   polyethylene glycol powder powder Commonly known as:  GLYCOLAX/MIRALAX   traMADol 50 MG tablet Commonly known as:  ULTRAM     TAKE these medications   albuterol (2.5 MG/3ML) 0.083% nebulizer solution Commonly known as:  PROVENTIL Take 2.5 mg by nebulization every 6 (six) hours as needed for wheezing or shortness of breath.   PROAIR RESPICLICK 108 (90 Base) MCG/ACT Aepb Generic drug:  Albuterol Sulfate Inhale 2 puffs into the lungs every 4 (four) hours as needed for wheezing.   beclomethasone 40 MCG/ACT inhaler Commonly known as:  QVAR REDIHALER Inhale 1 puff into the lungs 2 (two) times daily.   metoCLOPramide 10 MG tablet Commonly known as:  REGLAN Take 1 tablet (10 mg total) by mouth 3 (three) times daily before meals.   oseltamivir 75 MG capsule Commonly known as:  TAMIFLU Take 1 capsule (75 mg total) by mouth 2 (two) times daily.   pantoprazole 40 MG tablet Commonly known as:  PROTONIX Take 1 tablet (40 mg total) by mouth daily. Start taking on:  June 25, 2018   PNV PO Take 1 capsule by mouth daily.      Follow-up Information    Ramsay, Vernona Rieger, MD Follow up in 1 day(s).   Specialty:  Obstetrics and Gynecology Contact information: [address] [place] [address] [phone]           Signed: Scheryl Darter M.D. 06/24/2018, 10:29 AM

## 2018-06-24 NOTE — Discharge Instructions (Signed)
Pregnancy and Influenza    Influenza, also called the flu, is an infection of the lungs and airways (respiratory tract). If you are pregnant, you are more likely to catch the flu. You are also more likely to have a more serious case of the flu. This is because pregnancy causes changes to your body's disease-fighting system (immune system), heart, and lungs. If you develop a bad case of the flu, especially with a high fever, this can cause problems for you and your developing baby.  How do people get the flu?  The flu is caused by a type of germ called a virus. It spreads when virus particles get passed from person to person by:   Being near a sick person who is coughing or sneezing.   Touching something that has the virus on it and then touching your mouth, nose, or face.  The influenza virus is most common during the fall and winter.  How can I protect myself against the flu?   Get a flu shot. The best way to prevent the flu is to get a flu shot before flu season starts. The flu shot is not dangerous for your developing baby. It may even help protect your baby from the flu for up to 6 months after birth.   Wash your hands often with soap and warm water. If soap and water are not available, use hand sanitizer.   Do not come in close contact with sick people.   Do not share food, drinks, or utensils with other people.   Avoid touching your eyes, nose, and mouth.   Clean frequently used surfaces at home, school, or work.   Practice healthy lifestyle habits, such as:  ? Eating a healthy, balanced diet.  ? Drinking plenty of fluids.  ? Exercising regularly or as told by your health care provider.  ? Sleeping 7-9 hours each night.  ? Finding ways to manage stress.  What should I do if I have flu symptoms?   If you have any symptoms of the flu, even after getting a flu shot, contact your health care provider right away.   To reduce fever, take over-the-counter acetaminophen as told by your health care  provider.   If you have the flu, you may get antiviral medicine to keep the flu from becoming severe and to shorten how long it lasts.   Avoid spreading the flu to others:  ? Stay home until you are well.  ? Cover your nose and mouth when you cough or sneeze.  ? Wash your hands often.  Follow these instructions at home:   Take over-the-counter and prescription medicines only as told by your health care provider. Do not take any medicine, including cold or flu medicine, unless your health care provider tells you to do so.   If you were prescribed antiviral medicine, take it as told by your health care provider. Do not stop taking the antiviral medicine even if you start to feel better.   Eat a nutrient-rich diet that includes fresh fruits and vegetables, whole grains, lean protein, and low-fat dairy.   Drink enough fluid to keep your urine clear or pale yellow.   Get plenty of rest.  Contact a health care provider if:   You have fever or chills.   You have a cough, sore throat, or stuffy nose.   You have worsening or unusual:  ? Muscle aches.  ? Headache.  ? Tiredness.  ? Loss of appetite.   You   have vomiting or diarrhea.  Get help right away if:   You have trouble breathing.   You have chest pain.   You have abdominal pain.   You begin to have labor pains.   You have a fever that does not go down 24 hours after you take medicine.   You do not feel your baby move.   You have diarrhea or vomiting that will not go away.   You have dizziness or confusion.   Your symptoms do not improve, even with treatment.  Summary   If you are pregnant, you are more likely to catch the flu. You are also more likely to have a more serious case of the flu.   If you have flu-like symptoms, call your health care provider right away. If you develop a bad case of the flu, especially with a high fever, this can be dangerous for your developing baby.   The best way to prevent the flu is to get a flu shot before flu  season starts. The flu shot is not dangerous for your developing baby.   If you have the flu and were prescribed antiviral medicine, take it as told by your health care provider.  This information is not intended to replace advice given to you by your health care provider. Make sure you discuss any questions you have with your health care provider.  Document Released: 02/17/2008 Document Revised: 06/12/2016 Document Reviewed: 06/12/2016  Elsevier Interactive Patient Education  2019 [employer]

## 2018-06-25 LAB — CULTURE, BLOOD (ROUTINE X 2): Special Requests: ADEQUATE

## 2018-06-28 LAB — CULTURE, BLOOD (ROUTINE X 2): Culture: NO GROWTH

## 2018-10-03 ENCOUNTER — Encounter (HOSPITAL_COMMUNITY): Payer: Self-pay | Admitting: *Deleted

## 2018-10-03 ENCOUNTER — Inpatient Hospital Stay (HOSPITAL_COMMUNITY)
Admission: AD | Admit: 2018-10-03 | Discharge: 2018-10-03 | Disposition: A | Discharge status: Home / Self Care | Payer: BC Managed Care – PPO | Attending: Obstetrics and Gynecology | Admitting: Obstetrics and Gynecology

## 2018-10-03 ENCOUNTER — Other Ambulatory Visit: Payer: Self-pay

## 2018-10-03 DIAGNOSIS — O9089 Other complications of the puerperium, not elsewhere classified: Secondary | ICD-10-CM | POA: Insufficient documentation

## 2018-10-03 DIAGNOSIS — Z87891 Personal history of nicotine dependence: Secondary | ICD-10-CM | POA: Diagnosis not present

## 2018-10-03 NOTE — MAU Note (Signed)
Presents with c/o "meat hanging out of vagina", unsure if she pulled a stitch.  Reports delivered vaginally on 09/22/2018, obtained stitches.

## 2018-10-03 NOTE — MAU Provider Note (Signed)
History     CSN: [id number]  Arrival date and time: 10/03/18 1557   First Provider Initiated Contact with Patient 10/03/18 1640       28 y.o. [id number] s/p SVD 10 days ago presenting with "something having out of me". She was voiding warlier today then felt something come out of her vagina. She described it as looking like meat. Lochia has been small, changes pad q2 hrs for hygiene only. Denies fevers. Having some mild uterine cramps.  Rates 3/10. Has not taken anything for it. Has not put anything in vagina.   OB History    Gravida  3   Para  2   Term  2   Preterm      AB  1   Living  1     SAB      TAB      Ectopic      Multiple      Live Births  1           Past Medical History:  Diagnosis Date  . Asthma   . Depression   . Obesity     Past Surgical History:  Procedure Laterality Date  . DILATION AND CURETTAGE OF UTERUS      History reviewed. No pertinent family history.  Social History   Tobacco Use  . Smoking status: Former Games developer  . Smokeless tobacco: Never Used  Substance Use Topics  . Alcohol use: No  . Drug use: No    Allergies:  Allergies  Allergen Reactions  . Escitalopram Anaphylaxis and Shortness Of Breath    "Felt like throat was closing"- had to take Benadryl   . Lexapro [Escitalopram Oxalate] Anaphylaxis and Shortness Of Breath    Felt like throat was closing (at home/took Benadryl)  . Ademetionine Hives and Rash  . Bupropion Rash    "Once it builds up in my system"    Medications Prior to Admission  Medication Sig Dispense Refill Last Dose  . albuterol (PROVENTIL) (2.5 MG/3ML) 0.083% nebulizer solution Take 2.5 mg by nebulization every 6 (six) hours as needed for wheezing or shortness of breath.   unk at [employer]  . beclomethasone (QVAR REDIHALER) 40 MCG/ACT inhaler Inhale 1 puff into the lungs 2 (two) times daily. 1 Inhaler 1   . metoCLOPramide (REGLAN) 10 MG tablet Take 1 tablet (10 mg total) by mouth 3 (three) times daily  before meals. 60 tablet 1   . oseltamivir (TAMIFLU) 75 MG capsule Take 1 capsule (75 mg total) by mouth 2 (two) times daily. 6 capsule 0   . pantoprazole (PROTONIX) 40 MG tablet Take 1 tablet (40 mg total) by mouth daily. 30 tablet 1   . Prenatal Vit w/Fe-Methylfol-FA (PNV PO) Take 1 capsule by mouth daily.   On hold at On hold  . PROAIR RESPICLICK 108 (90 Base) MCG/ACT AEPB Inhale 2 puffs into the lungs every 4 (four) hours as needed for wheezing.   06/22/2018 at Unknown time    Review of Systems  Constitutional: Negative for chills and fever.  Gastrointestinal: Positive for abdominal pain.  Genitourinary: Positive for vaginal bleeding.   Physical Exam   Blood pressure 104/74, pulse 96, temperature 97.9 F (36.6 C), temperature source Oral, resp. rate 20, height 5' (1.524 m), weight 65.3 kg, SpO2 98 %, unknown if currently breastfeeding.  Physical Exam  Constitutional: She is oriented to person, place, and time. She appears well-developed and well-nourished. No distress.  HENT:  Head: Normocephalic and atraumatic.  Neck: Normal range of motion.  Cardiovascular: Normal rate.  Respiratory: Effort normal. No respiratory distress.  GI: Soft. She exhibits no distension and no mass. There is no abdominal tenderness. There is no rebound and no guarding.  Genitourinary:    Genitourinary Comments: External: no lesions or erythema Vagina: rugated, pink, moist, scant red bloody discharge in vault; small piece of tissue appearing to be amniotic membranes noted on peripad Uterus: + enlarged, anteverted, non tender, no CMT Adnexae: no masses, no tenderness left, no tenderness right Cervix closed    Musculoskeletal: Normal range of motion.  Neurological: She is alert and oriented to person, place, and time.  Skin: Skin is warm and dry.  Psychiatric: She has a normal mood and affect.   No results found for this or any previous visit (from the past 24 hour(s)).  MAU Course   Procedures  MDM No evidence of retained POCs or endometritis. Stable for discharge home.   Assessment and Plan   1. Postpartum state    [facility]charge [facility] Return precautions  Allergies as of 10/03/2018      Reactions   Escitalopram Anaphylaxis, Shortness Of Breath   "Felt like throat was closing"- had to take Benadryl   Lexapro [escitalopram Oxalate] Anaphylaxis, Shortness Of Breath   Felt like throat was closing (at home/took Benadryl)   Ademetionine Hives, Rash   Bupropion Rash   "Once it builds up in my system"      Medication List    STOP taking these medications   metoCLOPramide 10 MG tablet Commonly known as:  REGLAN   oseltamivir 75 MG capsule Commonly known as:  TAMIFLU   pantoprazole 40 MG tablet Commonly known as:  PROTONIX   PNV PO     TAKE these medications   albuterol (2.5 MG/3ML) 0.083% nebulizer solution Commonly known as:  PROVENTIL Take 2.5 mg by nebulization every 6 (six) hours as needed for wheezing or shortness of breath.   ProAir RespiClick 108 (90 Base) MCG/ACT Aepb Generic drug:  Albuterol Sulfate Inhale 2 puffs into the lungs every 4 (four) hours as needed for wheezing.   beclomethasone 40 MCG/ACT inhaler Commonly known as:  Qvar RediHaler Inhale 1 puff into the lungs 2 (two) times daily.      Donette LarryMelanie Devota Viruet, CNM 10/03/2018, 5:04 PM

## 2018-10-03 NOTE — Discharge Instructions (Signed)

## 2019-02-10 ENCOUNTER — Ambulatory Visit: Payer: BC Managed Care – PPO | Admitting: Allergy and Immunology

## 2019-03-03 ENCOUNTER — Other Ambulatory Visit: Payer: Self-pay

## 2019-03-03 ENCOUNTER — Encounter: Payer: Self-pay | Admitting: Allergy and Immunology

## 2019-03-03 ENCOUNTER — Ambulatory Visit: Payer: BC Managed Care – PPO | Admitting: Allergy and Immunology

## 2019-03-03 VITALS — BP 116/78 | HR 98 | Temp 98.0°F | Resp 18 | Ht 60.5 in | Wt 171.0 lb

## 2019-03-03 DIAGNOSIS — L502 Urticaria due to cold and heat: Secondary | ICD-10-CM

## 2019-03-03 DIAGNOSIS — J301 Allergic rhinitis due to pollen: Secondary | ICD-10-CM | POA: Diagnosis not present

## 2019-03-03 DIAGNOSIS — J4520 Mild intermittent asthma, uncomplicated: Secondary | ICD-10-CM | POA: Diagnosis not present

## 2019-03-03 DIAGNOSIS — T782XXD Anaphylactic shock, unspecified, subsequent encounter: Secondary | ICD-10-CM | POA: Diagnosis not present

## 2019-03-03 DIAGNOSIS — L500 Allergic urticaria: Secondary | ICD-10-CM | POA: Diagnosis not present

## 2019-03-03 MED ORDER — EPINEPHRINE 0.3 MG/0.3ML IJ SOAJ: 0.3000 mg | Freq: Once | INTRAMUSCULAR | 2 refills | Status: AC

## 2019-03-03 NOTE — Progress Notes (Signed)
[place]   Dear Romie JumperMorgan Payne,  Thank you for referring Darrick GrinderBrandie Rivero to the [facility] [place] on 03/03/2019.   Below is a summation of this patient's evaluation and recommendations.  Thank you for your referral. I will keep you informed about this patient's response to treatment.   If you have any questions please do not hesitate to contact me.   Sincerely,  Jessica PriestEric J. Kozlow, MD Allergy / Immunology [facility]   ______________________________________________________________________    NEW PATIENT NOTE  Referring Provider: Nathaneil CanaryPayne, Morgan H, PA-C Primary Provider: Leonard DowningPayne, Morgan H, PA-C Date of office visit: 03/03/2019    Subjective:   Chief Complaint:  Darrick GrinderBrandie Murdaugh (DOB: [birth year]) is a 28 y.o. female who presents to the clinic on 03/03/2019 with a chief complaint of Insect Bite (On calf. Leg swelled from knee down) and Allergic Reaction (unknown bite) .     HPI: Eliseo GumBrandie presents to this clinic in evaluation of several issues.  First, approximately 2 months ago while visiting her mother in [place], at around 11 PM at night, she developed an anaphylactic reaction manifested as red raised itchy lesions across her body, diffuse pruritus, diarrhea, and vomiting requiring EMS evaluation and transportation to the emergency room and treatment with what sounds like systemic steroids.  She did not receive epinephrine as far as she knows.  Within several hours her reaction completely resolved.  The next day she did notice 3 small red spots on her right foot.  There was no obvious provoking factor giving rise to this issue as she did not take any over-the-counter medication or any type of new food substance.  She did eat Kerrin ChampagneGolden Graham cereal about 1/2-hour before this reaction and ate [place] food about 6 hours before this reaction.  Second,  she has a history of cold-induced hives.  If she gets cold at the areas of contact she will develop itchiness and red blotchy areas that resolves after warming up.  She never develops any associated systemic or constitutional symptoms with this issue.  This has been present for at least the past year.  Third, while pregnant she had diffuse pruritus and dermatographia requiring evaluation with a dermatologist and treatment with Claritin on a daily basis which did help this issue.  She delivered in May 2020 but has continued to use her Claritin.  Fourth, there is a history of "allergies" that appears to occur during the spring and fall.  Her allergies are predominantly a "drainage" issue in her throat and she states that sometimes she develops "bronchitis" with unrelenting coughing about 1 time per year usually in the spring.  Most recently she was given prednisone in January 2020 for an 8-week history of cough that actually required a hospitalization.  Apparently she may have had influenza.  She was given both inhaled albuterol and Qvar at that point but she has only used this agent 3 times total since that event.  Fifth, she has a history of large local reactions to hymenoptera venom including fire ants and wasp and yellow jacket without any associated systemic or constitutional symptoms.  Past Medical History:  Diagnosis Date  . Angio-edema   . Asthma   . Depression   . Obesity   . Urticaria     Past Surgical History:  Procedure Laterality Date  . DILATION AND CURETTAGE OF UTERUS      Allergies  as of 03/03/2019      Reactions   Escitalopram Anaphylaxis, Shortness Of Breath   "Felt like throat was closing"- had to take Benadryl   Lexapro [escitalopram Oxalate] Anaphylaxis, Shortness Of Breath   Felt like throat was closing (at home/took Benadryl)   Ademetionine Hives, Rash   Hornet Venom Hives   S-adenosylmethionine Hives   Wasp Venom Protein Hives   Bupropion Rash   "Once it builds  up in my system"      Medication List      albuterol (2.5 MG/3ML) 0.083% nebulizer solution Commonly known as: PROVENTIL Take 2.5 mg by nebulization every 6 (six) hours as needed for wheezing or shortness of breath.   ProAir RespiClick 108 (90 Base) MCG/ACT Aepb Generic drug: Albuterol Sulfate Inhale 2 puffs into the lungs every 4 (four) hours as needed for wheezing.   beclomethasone 40 MCG/ACT inhaler Commonly known as: Qvar RediHaler Inhale 1 puff into the lungs 2 (two) times daily. What changed: additional instructions       Review of systems negative except as noted in HPI / PMHx or noted below:  Review of Systems  Constitutional: Negative.   HENT: Negative.   Eyes: Negative.   Respiratory: Negative.   Cardiovascular: Negative.   Gastrointestinal: Negative.   Genitourinary: Negative.   Musculoskeletal: Negative.   Skin: Negative.   Neurological: Negative.   Endo/Heme/Allergies: Negative.   Psychiatric/Behavioral: Negative.     Family History  Problem Relation Age of Onset  . Allergic rhinitis Mother   . Angioedema Neg Hx   . Asthma Neg Hx   . Atopy Neg Hx   . Eczema Neg Hx   . Immunodeficiency Neg Hx   . Urticaria Neg Hx     Social History   Socioeconomic History  . Marital status: Married    Spouse name: Not on file  . Number of children: Not on file  . Years of education: Not on file  . Highest education level: Not on file  Occupational History  . Not on file  Social Needs  . Financial resource strain: Not on file  . Food insecurity    Worry: Not on file    Inability: Not on file  . Transportation needs    Medical: Not on file    Non-medical: Not on file  Tobacco Use  . Smoking status: Former Games developer  . Smokeless tobacco: Never Used  Substance and Sexual Activity  . Alcohol use: No  . Drug use: No  . Sexual activity: Not on file  Lifestyle  . Physical activity    Days per week: Not on file    Minutes per session: Not on file  .  Stress: Not on file  Relationships  . Social Musician on phone: Not on file    Gets together: Not on file    Attends religious service: Not on file    Active member of club or organization: Not on file    Attends meetings of clubs or organizations: Not on file    Relationship status: Not on file  . Intimate partner violence    Fear of current or ex partner: Not on file    Emotionally abused: Not on file    Physically abused: Not on file    Forced sexual activity: Not on file  Other Topics Concern  . Not on file  Social History Narrative  . Not on file    Environmental and Social history  Lives in a  house with a dry environment, cats and dogs and parents located inside the household, no carpet in the bedroom, no plastic on the bed, no plastic on the pillow, and no smoking ongoing with inside the household.  Objective:   Vitals:   03/03/19 1327  BP: 116/78  Pulse: 98  Resp: 18  Temp: 98 F (36.7 C)  SpO2: 98%   Height: 5' 0.5" (153.7 cm) Weight: 171 lb (77.6 kg)  Physical Exam Constitutional:      Appearance: She is not diaphoretic.  HENT:     Head: Normocephalic. No right periorbital erythema or left periorbital erythema.     Right Ear: Tympanic membrane, ear canal and external ear normal.     Left Ear: Tympanic membrane, ear canal and external ear normal.     Nose: Nose normal. No mucosal edema or rhinorrhea.     Mouth/Throat:     Pharynx: No oropharyngeal exudate.  Eyes:     General: Lids are normal.     Conjunctiva/sclera: Conjunctivae normal.     Pupils: Pupils are equal, round, and reactive to light.  Neck:     Thyroid: No thyromegaly.     Trachea: Trachea normal. No tracheal deviation.  Cardiovascular:     Rate and Rhythm: Normal rate and regular rhythm.     Heart sounds: Normal heart sounds, S1 normal and S2 normal. No murmur.  Pulmonary:     Effort: Pulmonary effort is normal. No respiratory distress.     Breath sounds: No stridor. No  wheezing or rales.  Chest:     Chest wall: No tenderness.  Abdominal:     General: There is no distension.     Palpations: Abdomen is soft. There is no mass.     Tenderness: There is no abdominal tenderness. There is no guarding or rebound.  Musculoskeletal:        General: No tenderness.  Lymphadenopathy:     Head:     Right side of head: No tonsillar adenopathy.     Left side of head: No tonsillar adenopathy.     Cervical: No cervical adenopathy.  Skin:    Coloration: Skin is not pale.     Findings: No erythema or rash.     Nails: There is no clubbing.   Neurological:     Mental Status: She is alert.     Diagnostics: Allergy skin tests were performed.  She demonstrated hypersensitivity to grass.  She did not demonstrate any hypersensitivity to a screening panel of foods.  Spirometry was performed and demonstrated an FEV1 of 3.06 @ 108 % of predicted. FEV1/FVC = 0.85  The patient had an Asthma Control Test with the following results: ACT Total Score: 25.    Results of blood tests obtained 03 February 2019 identifies creatinine 0.83 mg/DL, AST 12 U/L, ALT 14 U/L, WBC 10.4, hemoglobin 14.6, platelet 234, TSH 1.760 IU/mL.  Assessment and Plan:    1. Anaphylaxis, subsequent encounter   2. Allergic urticaria   3. Urticaria due to cold   4. Seasonal allergic rhinitis due to pollen   5. Asthma, mild intermittent, well-controlled     1.  Allergen avoidance measures?  2.  Auvi-Q 0.3, Benadryl, MD/ER evaluation for allergic reaction  3.  Continue Claritin 10 mg or Zyrtec 10 mg 1 time per day  4.  Continue albuterol HFA 2 inhalations every 4-6 hours if needed  5.  Blood -hymenoptera venom panel, fire ant IgE, alpha gal panel, CBC with differential, thyroid  peroxidase antibody  6.  Further evaluation and treatment?  Yes, if recurrent reactions  7.  Obtain fall flu vaccine (and Covid vaccine)  Shanena appears to have a atopic immune system and it is quite possible that her  recent reaction that occurred involving multiple organs of her body may have been based on atopic disease.  She may have had a fire ant reaction especially given her history of developing 3 small spots on her foot after her reaction of anaphylaxis.  We will investigate for venom allergy with the blood test noted above and also investigate for alpha gal syndrome and other forms of immunological hyperreactivity.  Her respiratory atopic disease appears to be relatively mild at this point.  Her cold induced urticaria also appears to be relatively mild and she can treat this with an antihistamine.  I will contact her with the results of her blood test once they are available for review.  Jiles Prows, MD Allergy / Immunology [place] [facility] [place]

## 2019-03-03 NOTE — Patient Instructions (Addendum)
  1.  Allergen avoidance measures?  2.  Auvi-Q 0.3, Benadryl, MD/ER evaluation for allergic reaction  3.  Continue Claritin 10 mg or Zyrtec 10 mg 1 time per day  4.  Continue albuterol HFA 2 inhalations every 4-6 hours if needed  5.  Blood -hymenoptera venom panel, fire ant IgE, alpha gal panel, CBC with differential, thyroid peroxidase antibody  6.  Further evaluation and treatment?  Yes, if recurrent reactions  7.  Obtain fall flu vaccine (and Covid vaccine)

## 2019-03-04 ENCOUNTER — Encounter: Payer: Self-pay | Admitting: Allergy and Immunology

## 2019-03-10 LAB — CBC WITH DIFFERENTIAL/PLATELET
Basophils Absolute: 0.1 10*3/uL (ref 0.0–0.2)
Basos: 1 %
EOS (ABSOLUTE): 0.2 10*3/uL (ref 0.0–0.4)
Eos: 2 %
Hematocrit: 45.1 % (ref 34.0–46.6)
Hemoglobin: 15 g/dL (ref 11.1–15.9)
Immature Grans (Abs): 0 10*3/uL (ref 0.0–0.1)
Immature Granulocytes: 0 %
Lymphocytes Absolute: 3.2 10*3/uL — ABNORMAL HIGH (ref 0.7–3.1)
Lymphs: 34 %
MCH: 28.7 pg (ref 26.6–33.0)
MCHC: 33.3 g/dL (ref 31.5–35.7)
MCV: 86 fL (ref 79–97)
Monocytes Absolute: 0.6 10*3/uL (ref 0.1–0.9)
Monocytes: 7 %
Neutrophils Absolute: 5.2 10*3/uL (ref 1.4–7.0)
Neutrophils: 56 %
Platelets: 264 10*3/uL (ref 150–450)
RBC: 5.23 x10E6/uL (ref 3.77–5.28)
RDW: 13.1 % (ref 11.7–15.4)
WBC: 9.3 10*3/uL (ref 3.4–10.8)

## 2019-03-10 LAB — ALLERGEN HYMENOPTERA PANEL
Bumblebee: <0.1 kU/L
Honeybee IgE: <0.1 kU/L
Hornet, White Face, IgE: <0.1 kU/L
Hornet, Yellow, IgE: <0.1 kU/L
Paper Wasp IgE: 0.34 kU/L — AB
Yellow Jacket, IgE: 0.1 kU/L — AB

## 2019-03-10 LAB — ALPHA-GAL PANEL
Alpha Gal IgE*: 1.99 kU/L — ABNORMAL HIGH (ref <0.10)
Beef (Bos spp) IgE: 1.85 kU/L — ABNORMAL HIGH (ref <0.35)
Class Interpretation: 2
Class Interpretation: 2
Class Interpretation: 2
Lamb/Mutton (Ovis spp) IgE: 1.69 kU/L — ABNORMAL HIGH (ref <0.35)
Pork (Sus spp) IgE: 1.74 kU/L — ABNORMAL HIGH (ref <0.35)

## 2019-03-10 LAB — ALLERGEN FIRE ANT: I070-IgE Fire Ant (Invicta): 0.58 kU/L — AB

## 2019-03-10 LAB — THYROID PEROXIDASE ANTIBODY: Thyroperoxidase Ab SerPl-aCnc: 21 IU/mL (ref 0–34)

## 2019-03-12 ENCOUNTER — Telehealth: Payer: Self-pay | Admitting: *Deleted

## 2019-03-12 NOTE — Telephone Encounter (Signed)
Can you please contact Erica Farrell regarding insurance coverage/cost for her upcoming venom clinic appointment. She will have the venom skin test performed.

## 2019-03-13 ENCOUNTER — Telehealth: Payer: Self-pay | Admitting: Allergy and Immunology

## 2019-03-13 NOTE — Telephone Encounter (Signed)
Tried calling but no answer and voicemail is not setup. I will try her back later.

## 2019-03-13 NOTE — Telephone Encounter (Signed)
Spoke to Elba about venom testing. She is going to call her insurance to see if they will cover the testing.

## 2019-04-22 ENCOUNTER — Telehealth: Payer: Self-pay | Admitting: Allergy and Immunology

## 2019-04-22 NOTE — Telephone Encounter (Signed)
Called patient for appointment reminder. Pt was scheduled for venom clinic testing on 12/29. We cancelled this appt because pt is [redacted] weeks pregnant. Pt see obgyn on 1/12 to speak about allergies and testing. Please confirm if venom testing should be held off during pregnancy.

## 2019-04-27 NOTE — Telephone Encounter (Signed)
No skin testing while pregnant.

## 2019-04-27 NOTE — Telephone Encounter (Signed)
Called and was able to get a dial tone that resulted in going to voicemail. Unable to leave a message due to voicemail not being set up. Will need to attempt to call the patient again.

## 2019-04-27 NOTE — Telephone Encounter (Signed)
Attempted to call patient to inform, call went straight to voicemail and there was no voicemail set up. Will need to attempt to call again.

## 2019-04-27 NOTE — Telephone Encounter (Signed)
Spoke with patient. Patient made aware of Dr. Bruna Potter recommendation. She verbalized understanding.

## 2019-04-28 ENCOUNTER — Other Ambulatory Visit: Payer: BC Managed Care – PPO | Admitting: Pediatrics

## 2021-01-09 IMAGING — CR DG RIBS W/ CHEST 3+V*R*
4 series · 4 of 4 positions shown · non-contrast
Comparison: None.

CLINICAL DATA: Cough, pain

EXAM:
RIGHT RIBS AND CHEST - 3+ VIEW

[chest pa]
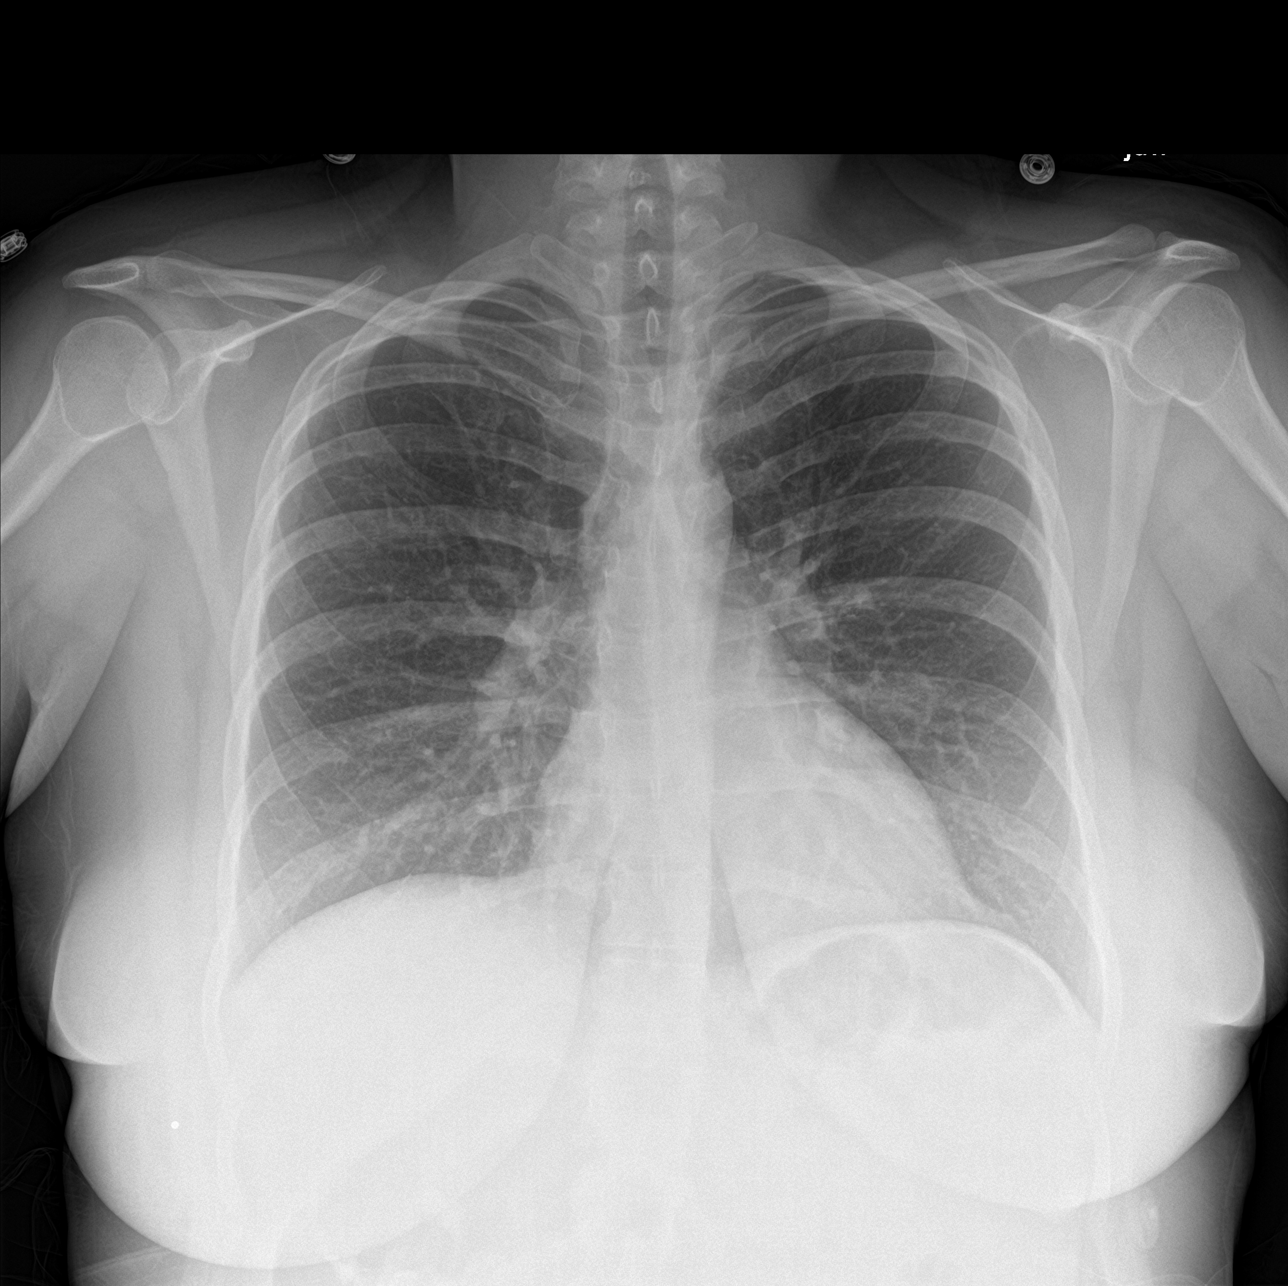

[rib ap (1 of 2)]
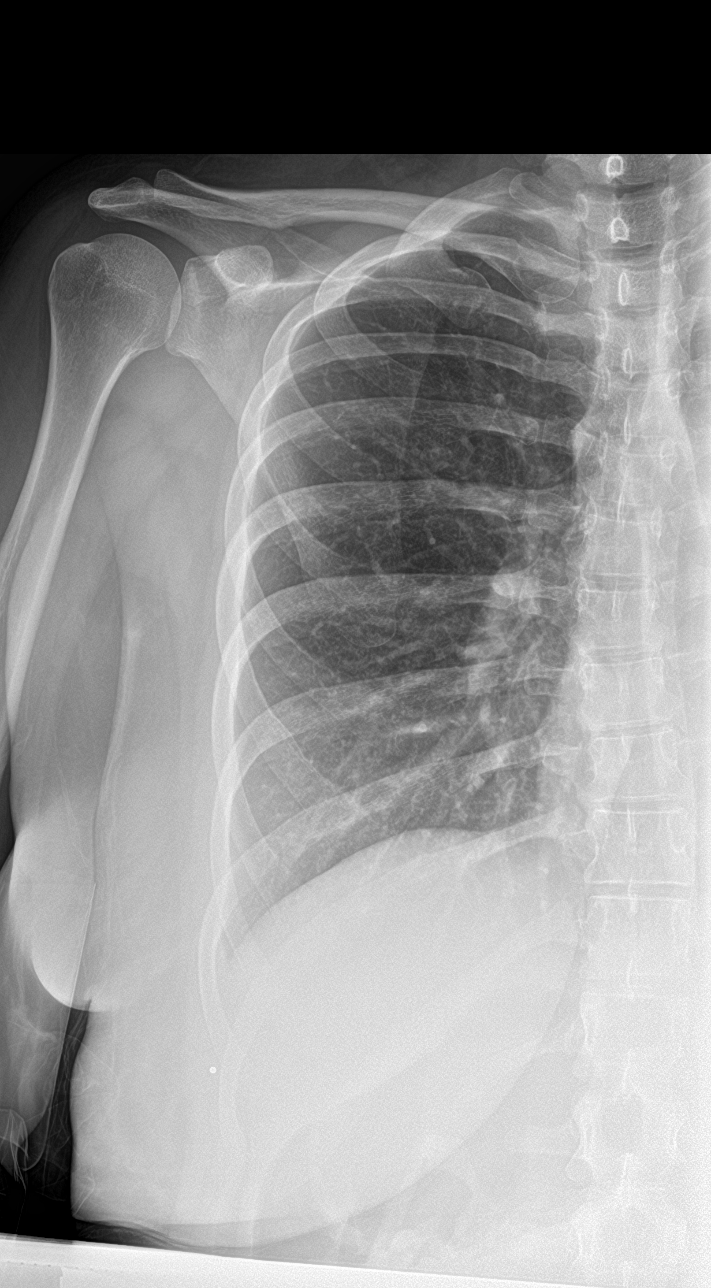

[rib ap (2 of 2)]
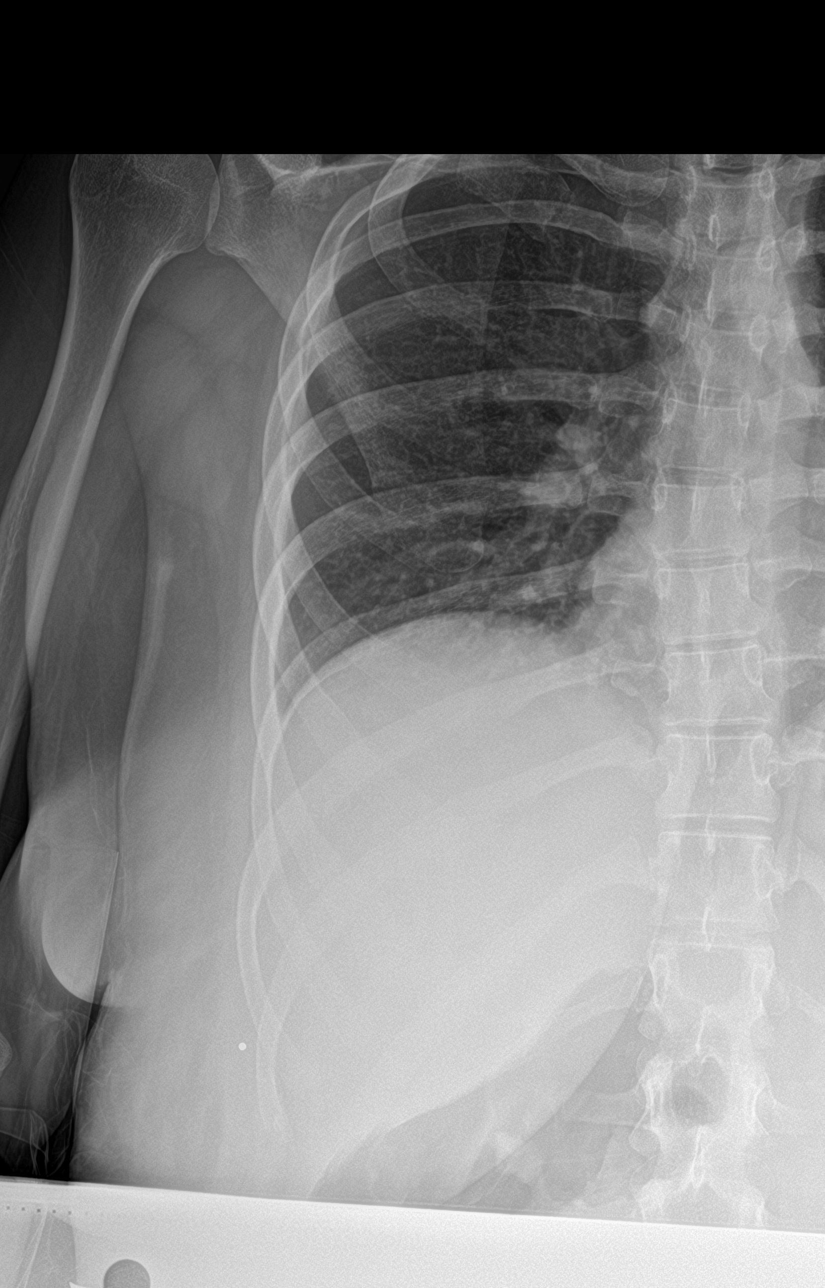

[rib ap obl]
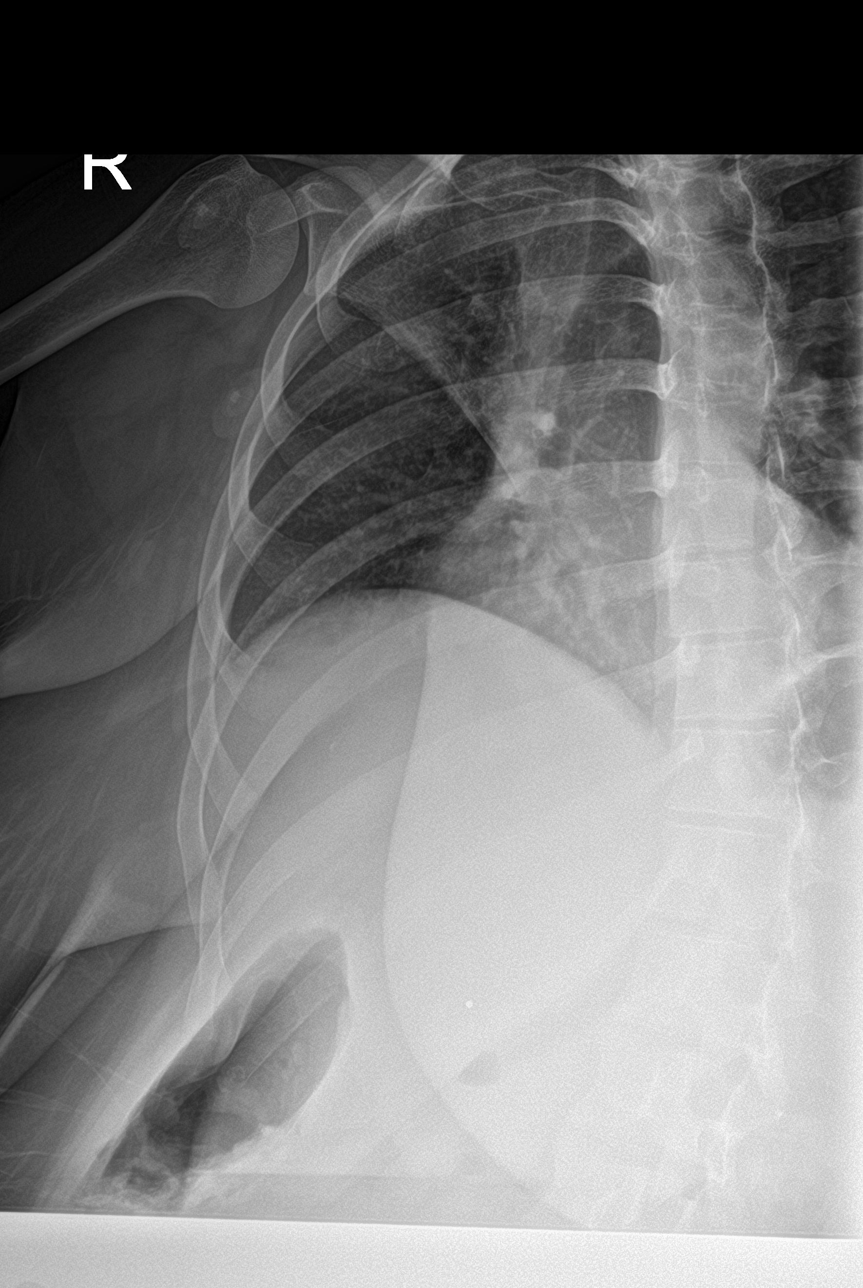

[4 of 4 positions shown; findings below may reference images not displayed]

FINDINGS: No fracture or other bone lesions are seen involving the ribs. There
is no evidence of pneumothorax or pleural effusion. Both lungs are
clear. Heart size and mediastinal contours are within normal limits.
IMPRESSION: Negative.
